# Patient Record
Sex: Male | Born: 1945 | Race: White | Hispanic: No | Marital: Married | Smoking: Former smoker
Health system: Southern US, Community
[De-identification: ages and names within clinical notes are randomized; demographics above are authoritative.]

## PROBLEM LIST (undated history)

## (undated) DIAGNOSIS — I10 Essential (primary) hypertension: Secondary | ICD-10-CM

## (undated) DIAGNOSIS — R011 Cardiac murmur, unspecified: Secondary | ICD-10-CM

## (undated) DIAGNOSIS — E785 Hyperlipidemia, unspecified: Secondary | ICD-10-CM

## (undated) DIAGNOSIS — I38 Endocarditis, valve unspecified: Secondary | ICD-10-CM

## (undated) DIAGNOSIS — M1990 Unspecified osteoarthritis, unspecified site: Secondary | ICD-10-CM

## (undated) DIAGNOSIS — T753XXA Motion sickness, initial encounter: Secondary | ICD-10-CM

## (undated) DIAGNOSIS — R0600 Dyspnea, unspecified: Secondary | ICD-10-CM

## (undated) DIAGNOSIS — G4730 Sleep apnea, unspecified: Secondary | ICD-10-CM

## (undated) DIAGNOSIS — K219 Gastro-esophageal reflux disease without esophagitis: Secondary | ICD-10-CM

## (undated) HISTORY — PX: ESOPHAGOGASTRODUODENOSCOPY: SHX1529

## (undated) HISTORY — PX: HEMORRHOID SURGERY: SHX153

## (undated) HISTORY — DX: Hyperlipidemia, unspecified: E78.5

## (undated) HISTORY — PX: COLONOSCOPY: SHX174

---

## 2004-08-10 ENCOUNTER — Inpatient Hospital Stay: Payer: Self-pay | Admitting: Surgery

## 2004-08-10 ENCOUNTER — Ambulatory Visit: Payer: Self-pay | Admitting: Internal Medicine

## 2007-08-05 ENCOUNTER — Ambulatory Visit: Payer: Self-pay | Admitting: Internal Medicine

## 2008-03-27 ENCOUNTER — Ambulatory Visit: Payer: Self-pay | Admitting: Gastroenterology

## 2010-07-14 ENCOUNTER — Ambulatory Visit: Payer: Self-pay | Admitting: Internal Medicine

## 2010-10-10 ENCOUNTER — Ambulatory Visit: Payer: Self-pay | Admitting: Gastroenterology

## 2010-10-13 LAB — PATHOLOGY REPORT

## 2010-11-16 ENCOUNTER — Ambulatory Visit: Payer: Self-pay | Admitting: Gastroenterology

## 2011-03-26 ENCOUNTER — Emergency Department: Payer: Self-pay | Admitting: Internal Medicine

## 2011-03-26 LAB — HEPATIC FUNCTION PANEL A (ARMC)
Albumin: 3.4 g/dL (ref 3.4–5.0)
Alkaline Phosphatase: 52 U/L (ref 50–136)
Bilirubin,Total: 0.8 mg/dL (ref 0.2–1.0)
SGOT(AST): 28 U/L (ref 15–37)
SGPT (ALT): 27 U/L

## 2011-03-26 LAB — CBC
HCT: 44.9 % (ref 40.0–52.0)
HGB: 14.9 g/dL (ref 13.0–18.0)
MCH: 28.3 pg (ref 26.0–34.0)
MCHC: 33.2 g/dL (ref 32.0–36.0)
MCV: 85 fL (ref 80–100)

## 2011-03-26 LAB — URINALYSIS, COMPLETE
Bacteria: NONE SEEN
Bilirubin,UR: NEGATIVE
Glucose,UR: NEGATIVE mg/dL (ref 0–75)
Ketone: NEGATIVE
Ph: 7 (ref 4.5–8.0)
RBC,UR: NONE SEEN /HPF (ref 0–5)
Specific Gravity: 1.002 (ref 1.003–1.030)
Squamous Epithelial: NONE SEEN
WBC UR: <1 /HPF (ref 0–5)

## 2011-03-26 LAB — BASIC METABOLIC PANEL
Anion Gap: 8 (ref 7–16)
BUN: 11 mg/dL (ref 7–18)
Chloride: 101 mmol/L (ref 98–107)
Co2: 29 mmol/L (ref 21–32)
Creatinine: 0.87 mg/dL (ref 0.60–1.30)
EGFR (African American): >60
Potassium: 3.6 mmol/L (ref 3.5–5.1)
Sodium: 138 mmol/L (ref 136–145)

## 2011-03-27 LAB — URINE CULTURE

## 2011-04-27 ENCOUNTER — Ambulatory Visit: Payer: Self-pay | Admitting: Family Medicine

## 2011-04-27 LAB — CREATININE, SERUM: EGFR (African American): >60

## 2011-11-29 ENCOUNTER — Ambulatory Visit: Payer: Self-pay | Admitting: Gastroenterology

## 2012-10-11 ENCOUNTER — Ambulatory Visit: Payer: Self-pay | Admitting: Family Medicine

## 2013-03-19 ENCOUNTER — Ambulatory Visit: Payer: Self-pay | Admitting: Gastroenterology

## 2013-11-11 ENCOUNTER — Ambulatory Visit: Payer: Self-pay | Admitting: Ophthalmology

## 2014-08-06 ENCOUNTER — Other Ambulatory Visit: Payer: Self-pay | Admitting: Unknown Physician Specialty

## 2014-08-06 DIAGNOSIS — M25512 Pain in left shoulder: Secondary | ICD-10-CM

## 2014-08-06 DIAGNOSIS — M7542 Impingement syndrome of left shoulder: Secondary | ICD-10-CM

## 2014-08-18 ENCOUNTER — Ambulatory Visit
Admission: RE | Admit: 2014-08-18 | Discharge: 2014-08-18 | Disposition: A | Discharge status: Home / Self Care | Payer: Medicare PPO | Source: Ambulatory Visit | Attending: Unknown Physician Specialty | Admitting: Unknown Physician Specialty

## 2014-08-18 DIAGNOSIS — M25512 Pain in left shoulder: Secondary | ICD-10-CM

## 2014-08-18 DIAGNOSIS — M7542 Impingement syndrome of left shoulder: Secondary | ICD-10-CM

## 2014-08-18 DIAGNOSIS — M75112 Incomplete rotator cuff tear or rupture of left shoulder, not specified as traumatic: Secondary | ICD-10-CM | POA: Diagnosis not present

## 2014-09-07 ENCOUNTER — Other Ambulatory Visit: Payer: Self-pay | Admitting: Family Medicine

## 2014-09-07 DIAGNOSIS — N5089 Other specified disorders of the male genital organs: Secondary | ICD-10-CM

## 2014-09-10 ENCOUNTER — Ambulatory Visit: Payer: Medicare PPO

## 2014-09-15 ENCOUNTER — Ambulatory Visit
Admission: RE | Admit: 2014-09-15 | Discharge: 2014-09-15 | Disposition: A | Discharge status: Home / Self Care | Payer: Medicare PPO | Source: Ambulatory Visit | Attending: Family Medicine | Admitting: Family Medicine

## 2014-09-15 DIAGNOSIS — N508 Other specified disorders of male genital organs: Secondary | ICD-10-CM | POA: Insufficient documentation

## 2014-09-15 DIAGNOSIS — N5089 Other specified disorders of the male genital organs: Secondary | ICD-10-CM

## 2014-09-30 ENCOUNTER — Encounter: Payer: Self-pay | Admitting: *Deleted

## 2014-10-06 NOTE — Discharge Instructions (Signed)

## 2014-10-07 ENCOUNTER — Encounter: Payer: Self-pay | Admitting: *Deleted

## 2014-10-07 ENCOUNTER — Encounter
Admission: RE | Disposition: A | Discharge status: Home / Self Care | Payer: Self-pay | Source: Ambulatory Visit | Attending: Gastroenterology

## 2014-10-07 ENCOUNTER — Ambulatory Visit
Admission: RE | Admit: 2014-10-07 | Discharge: 2014-10-07 | Disposition: A | Discharge status: Home / Self Care | Payer: Medicare PPO | Source: Ambulatory Visit | Attending: Gastroenterology | Admitting: Gastroenterology

## 2014-10-07 ENCOUNTER — Ambulatory Visit: Payer: Medicare PPO | Admitting: Anesthesiology

## 2014-10-07 DIAGNOSIS — K210 Gastro-esophageal reflux disease with esophagitis: Secondary | ICD-10-CM | POA: Insufficient documentation

## 2014-10-07 DIAGNOSIS — E785 Hyperlipidemia, unspecified: Secondary | ICD-10-CM | POA: Insufficient documentation

## 2014-10-07 DIAGNOSIS — Z79899 Other long term (current) drug therapy: Secondary | ICD-10-CM | POA: Insufficient documentation

## 2014-10-07 DIAGNOSIS — Z87891 Personal history of nicotine dependence: Secondary | ICD-10-CM | POA: Diagnosis not present

## 2014-10-07 DIAGNOSIS — M7542 Impingement syndrome of left shoulder: Secondary | ICD-10-CM | POA: Insufficient documentation

## 2014-10-07 DIAGNOSIS — R1310 Dysphagia, unspecified: Secondary | ICD-10-CM | POA: Insufficient documentation

## 2014-10-07 DIAGNOSIS — I10 Essential (primary) hypertension: Secondary | ICD-10-CM | POA: Insufficient documentation

## 2014-10-07 DIAGNOSIS — R0602 Shortness of breath: Secondary | ICD-10-CM | POA: Insufficient documentation

## 2014-10-07 HISTORY — DX: Motion sickness, initial encounter: T75.3XXA

## 2014-10-07 HISTORY — DX: Gastro-esophageal reflux disease without esophagitis: K21.9

## 2014-10-07 HISTORY — PX: ESOPHAGOGASTRODUODENOSCOPY: SHX5428

## 2014-10-07 HISTORY — DX: Unspecified osteoarthritis, unspecified site: M19.90

## 2014-10-07 HISTORY — DX: Essential (primary) hypertension: I10

## 2014-10-07 SURGERY — EGD (ESOPHAGOGASTRODUODENOSCOPY)
Anesthesia: Monitor Anesthesia Care

## 2014-10-07 MED ORDER — LIDOCAINE HCL (CARDIAC) 20 MG/ML IV SOLN
INTRAVENOUS | Status: DC | PRN
  Administered 2014-10-07: 50 mg via INTRAVENOUS

## 2014-10-07 MED ORDER — STERILE WATER FOR IRRIGATION IR SOLN
Status: DC | PRN
  Administered 2014-10-07: 09:00:00

## 2014-10-07 MED ORDER — LACTATED RINGERS IV SOLN
INTRAVENOUS | Status: DC
  Administered 2014-10-07: 08:00:00 via INTRAVENOUS

## 2014-10-07 MED ORDER — PROPOFOL 10 MG/ML IV BOLUS
INTRAVENOUS | Status: DC | PRN
  Administered 2014-10-07: 50 mg via INTRAVENOUS
  Administered 2014-10-07 (×3): 30 mg via INTRAVENOUS

## 2014-10-07 MED ORDER — SODIUM CHLORIDE 0.9 % IV SOLN: INTRAVENOUS | Status: DC

## 2014-10-07 MED ORDER — GLYCOPYRROLATE 0.2 MG/ML IJ SOLN
INTRAMUSCULAR | Status: DC | PRN
  Administered 2014-10-07: 0.2 mg via INTRAVENOUS

## 2014-10-07 SURGICAL SUPPLY — 41 items
BALLN DILATOR 10-12 8 (BALLOONS)
BALLN DILATOR 12-15 8 (BALLOONS)
BALLN DILATOR 15-18 8 (BALLOONS)
BALLN DILATOR CRE 0-12 8 (BALLOONS)
BALLN DILATOR ESOPH 8 10 CRE (MISCELLANEOUS) IMPLANT
BALLOON DILATOR 12-15 8 (BALLOONS) IMPLANT
BALLOON DILATOR 15-18 8 (BALLOONS) IMPLANT
BALLOON DILATOR CRE 0-12 8 (BALLOONS) IMPLANT
BLOCK BITE 60FR ADLT L/F GRN (MISCELLANEOUS) ×3 IMPLANT
CANISTER SUCT 1200ML W/VALVE (MISCELLANEOUS) ×3 IMPLANT
FCP ESCP3.2XJMB 240X2.8X (MISCELLANEOUS)
FORCEPS BIOP RAD 4 LRG CAP 4 (CUTTING FORCEPS) IMPLANT
FORCEPS BIOP RJ4 240 W/NDL (MISCELLANEOUS)
FORCEPS ESCP3.2XJMB 240X2.8X (MISCELLANEOUS) IMPLANT
GOWN CVR UNV OPN BCK APRN NK (MISCELLANEOUS) ×1 IMPLANT
GOWN ISOL THUMB LOOP REG UNIV (MISCELLANEOUS) ×2
GOWN STRL REUS W/ TWL LRG LVL3 (GOWN DISPOSABLE) ×1 IMPLANT
GOWN STRL REUS W/TWL LRG LVL3 (GOWN DISPOSABLE) ×2
HEMOCLIP INSTINCT (CLIP) IMPLANT
INJECTOR VARIJECT VIN23 (MISCELLANEOUS) IMPLANT
KIT CO2 TUBING (TUBING) IMPLANT
KIT DEFENDO VALVE AND CONN (KITS) IMPLANT
KIT ENDO PROCEDURE OLY (KITS) ×3 IMPLANT
LIGATOR MULTIBAND 6SHOOTER MBL (MISCELLANEOUS) IMPLANT
MARKER SPOT ENDO TATTOO 5ML (MISCELLANEOUS) IMPLANT
PAD GROUND ADULT SPLIT (MISCELLANEOUS) IMPLANT
SNARE SHORT THROW 13M SML OVAL (MISCELLANEOUS) IMPLANT
SNARE SHORT THROW 30M LRG OVAL (MISCELLANEOUS) IMPLANT
SPOT EX ENDOSCOPIC TATTOO (MISCELLANEOUS)
SUCTION POLY TRAP 4CHAMBER (MISCELLANEOUS) IMPLANT
SYR INFLATION 60ML (SYRINGE) IMPLANT
TRAP SUCTION POLY (MISCELLANEOUS) IMPLANT
TUBING CONN 6MMX3.1M (TUBING)
TUBING SUCTION CONN 0.25 STRL (TUBING) IMPLANT
UNDERPAD 30X60 958B10 (PK) (MISCELLANEOUS) IMPLANT
VALVE BIOPSY ENDO (VALVE) IMPLANT
VARIJECT INJECTOR VIN23 (MISCELLANEOUS)
WATER AUXILLARY (MISCELLANEOUS) IMPLANT
WATER STERILE IRR 250ML POUR (IV SOLUTION) ×3 IMPLANT
WATER STERILE IRR 500ML POUR (IV SOLUTION) IMPLANT
WIRE CRE 18-20MM 8CM F G (MISCELLANEOUS) IMPLANT

## 2014-10-07 NOTE — Op Note (Signed)
[facility] Gastroenterology Patient Name: Steven Basso Procedure Date: 10/07/2014 8:34 AM MRN: [id number] Account #: [id number] Date of Birth: 01-07-1946 Admit Type: Outpatient Age: 69 Room: [facility] OR ROOM 01 Gender: Male Note Status: Finalized Procedure:         Upper GI endoscopy Indications:       Dysphagia, Follow-up of esophageal reflux Providers:         Ezzard Standing. Bluford Kaufmann, MD Referring MD:      Nat Christen. Zada Finders, MD (Referring MD) Medicines:         Monitored Anesthesia Care Complications:     No immediate complications. Procedure:         Pre-Anesthesia Assessment:                    - Prior to the procedure, a History and Physical was                     performed, and patient medications, allergies and                     sensitivities were reviewed. The patient's tolerance of                     previous anesthesia was reviewed.                    - The risks and benefits of the procedure and the sedation                     options and risks were discussed with the patient. All                     questions were answered and informed consent was obtained.                    - After reviewing the risks and benefits, the patient was                     deemed in satisfactory condition to undergo the procedure.                    After obtaining informed consent, the endoscope was passed                     under direct vision. Throughout the procedure, the                     patient's blood pressure, pulse, and oxygen saturations                     were monitored continuously. The was introduced through                     the mouth, and advanced to the second part of duodenum.                     The upper GI endoscopy was accomplished without                     difficulty. The patient tolerated the procedure well. Findings:      The examined esophagus was normal. Biopsies were taken with a cold       forceps for histology.      The entire examined stomach  was normal.  The examined duodenum was normal. Impression:        - Normal esophagus. Biopsied.                    - Normal stomach.                    - Normal examined duodenum. Recommendation:    - Discharge patient to home.                    - Observe patient's clinical course.                    - Continue present medications.                    - Await pathology results.                    - The findings and recommendations were discussed with the                     patient. Procedure Code(s): --- Professional ---                    [phone], Esophagogastroduodenoscopy, flexible, transoral;                     with biopsy, single or multiple Diagnosis Code(s): --- Professional ---                    R13.10, Dysphagia, unspecified                    K21.9, Gastro-esophageal reflux disease without esophagitis CPT copyright 2014 American Medical Association. All rights reserved. The codes documented in this report are preliminary and upon coder review may  be revised to meet current compliance requirements. Wallace Cullens, MD 10/07/2014 9:21:06 AM This report has been signed electronically. Number of Addenda: 0 Note Initiated On: 10/07/2014 8:34 AM Total Procedure Duration: 0 hours 3 minutes 36 seconds       [facility]

## 2014-10-07 NOTE — Anesthesia Preprocedure Evaluation (Signed)
Anesthesia Evaluation  Patient identified by MRN, date of birth, ID band  Airway Mallampati: II  TM Distance: >3 FB Neck ROM: Full    Dental   Pulmonary former smoker,           Cardiovascular hypertension,      Neuro/Psych    GI/Hepatic GERD  Medicated,  Endo/Other    Renal/GU      Musculoskeletal   Abdominal   Peds  Hematology   Anesthesia Other Findings   Reproductive/Obstetrics                             Anesthesia Physical Anesthesia Plan  ASA: II  Anesthesia Plan: MAC   Post-op Pain Management:    Induction: Intravenous  Airway Management Planned:   Additional Equipment:   Intra-op Plan:   Post-operative Plan:   Informed Consent: I have reviewed the patients History and Physical, chart, labs and discussed the procedure including the risks, benefits and alternatives for the proposed anesthesia with the patient or authorized representative who has indicated his/her understanding and acceptance.     Plan Discussed with: CRNA  Anesthesia Plan Comments:         Anesthesia Quick Evaluation

## 2014-10-07 NOTE — Anesthesia Procedure Notes (Signed)
Procedure Name: MAC Performed by: AMYOT, MICHAEL Pre-anesthesia Checklist: Patient identified, Emergency Drugs available, Suction available, Timeout performed and Patient being monitored Patient Re-evaluated:Patient Re-evaluated prior to inductionOxygen Delivery Method: Nasal cannula Placement Confirmation: positive ETCO2       

## 2014-10-07 NOTE — Transfer of Care (Signed)
Immediate Anesthesia Transfer of Care Note  Patient: Perry Taylor  Procedure(s) Performed: Procedure(s): ESOPHAGOGASTRODUODENOSCOPY (EGD) (N/A)  Patient Location: PACU  Anesthesia Type: MAC  Level of Consciousness: awake, alert  and patient cooperative  Airway and Oxygen Therapy: Patient Spontanous Breathing and Patient connected to supplemental oxygen  Post-op Assessment: Post-op Vital signs reviewed, Patient's Cardiovascular Status Stable, Respiratory Function Stable, Patent Airway and No signs of Nausea or vomiting  Post-op Vital Signs: Reviewed and stable  Complications: No apparent anesthesia complications

## 2014-10-07 NOTE — H&P (Signed)
    Primary Care Physician:  Dione Housekeeper, MD Primary Gastroenterologist:  Dr. Bluford Kaufmann  Pre-Procedure History & Physical: HPI:  Perry Taylor is a 69 y.o. male is here for an EGD with possible dilation.   Past Medical History  Diagnosis Date  . Hypertension   . Motion sickness     boats, car-back seat  . Arthritis     fingers  . GERD (gastroesophageal reflux disease)     Past Surgical History  Procedure Laterality Date  . Hemorrhoid surgery    . Esophagogastroduodenoscopy    . Colonoscopy      Prior to Admission medications   Medication Sig Start Date End Date Taking? Authorizing Alexanderjames Berg  atorvastatin (LIPITOR) 20 MG tablet Take 20 mg by mouth daily. PM   Yes Historical Joran Kallal, MD  chlorthalidone (HYGROTON) 25 MG tablet Take 25 mg by mouth daily. AM   Yes Historical Kenly Xiao, MD  lisinopril (PRINIVIL,ZESTRIL) 20 MG tablet Take 20 mg by mouth daily. AM   Yes Historical Yosiah Jasmin, MD  Multiple Vitamins-Minerals (MULTIVITAMIN PO) Take by mouth.   Yes Historical Hopie Pellegrin, MD  Omega-3 Fatty Acids (FISH OIL PO) Take by mouth.   Yes Historical Mariska Daffin, MD  pantoprazole (PROTONIX) 40 MG tablet Take 40 mg by mouth daily. AM   Yes Historical Seeley Hissong, MD  psyllium (METAMUCIL) 58.6 % packet Take 1 packet by mouth daily.   Yes Historical Idona Stach, MD  UNABLE TO FIND Prost X. 1 tab 3 times daily   Yes Historical Lyam Provencio, MD  UNABLE TO FIND Symplex M. 1 tab 3 times daily   Yes Historical Anginette Espejo, MD    Allergies as of 09/10/2014  . (Not on File)    History reviewed. No pertinent family history.  Social History   Social History  . Marital Status: Married    Spouse Name: N/A  . Number of Children: N/A  . Years of Education: N/A   Occupational History  . Not on file.   Social History Main Topics  . Smoking status: Former Games developer  . Smokeless tobacco: Not on file     Comment: Quit 32 yrs ago  . Alcohol Use: No  . Drug Use: Not on file  . Sexual Activity: Not on  file   Other Topics Concern  . Not on file   Social History Narrative    Review of Systems: See HPI, otherwise negative ROS  Physical Exam: BP 128/77 mmHg  Pulse 66  Temp(Src) 97.9 F (36.6 C)  Resp 17  Ht  (1.778 m)  Wt 82.555 kg (182 lb)  BMI 26.11 kg/m2  SpO2 94% General:   Alert,  pleasant and cooperative in NAD Head:  Normocephalic and atraumatic. Neck:  Supple; no masses or thyromegaly. Lungs:  Clear throughout to auscultation.    Heart:  Regular rate and rhythm. Abdomen:  Soft, nontender and nondistended. Normal bowel sounds, without guarding, and without rebound.   Neurologic:  Alert and  oriented x4;  grossly normal neurologically.  Impression/Plan: Perry Taylor is here for an EGD to be performed for dysphagia, GERD. Risks, benefits, limitations, and alternatives regarding  EGD with possible dilation have been reviewed with the patient.  Questions have been answered.  All parties agreeable.   OH, Ezzard Standing, MD  10/07/2014, 8:57 AM

## 2014-10-07 NOTE — Anesthesia Postprocedure Evaluation (Signed)
  Anesthesia Post-op Note  Patient: Perry Taylor  Procedure(s) Performed: Procedure(s): ESOPHAGOGASTRODUODENOSCOPY (EGD) (N/A)  Anesthesia type:MAC  Patient location: PACU  Post pain: Pain level controlled  Post assessment: Post-op Vital signs reviewed, Patient's Cardiovascular Status Stable, Respiratory Function Stable, Patent Airway and No signs of Nausea or vomiting  Post vital signs: Reviewed and stable  Last Vitals:  Filed Vitals:   10/07/14 0926  BP: 130/91  Pulse: 72  Temp: 36.1 C  Resp: 16    Level of consciousness: awake, alert  and patient cooperative  Complications: No apparent anesthesia complications

## 2014-10-09 LAB — SURGICAL PATHOLOGY

## 2015-05-25 ENCOUNTER — Ambulatory Visit: Payer: Medicare PPO | Admitting: Internal Medicine

## 2015-06-11 ENCOUNTER — Encounter: Payer: Self-pay | Admitting: Oncology

## 2015-06-11 ENCOUNTER — Inpatient Hospital Stay: Payer: Medicare PPO | Attending: Oncology | Admitting: Oncology

## 2015-06-11 ENCOUNTER — Inpatient Hospital Stay: Payer: Medicare PPO

## 2015-06-11 VITALS — BP 134/81 | HR 66 | Temp 96.9°F | Wt 182.5 lb

## 2015-06-11 DIAGNOSIS — I10 Essential (primary) hypertension: Secondary | ICD-10-CM

## 2015-06-11 DIAGNOSIS — F17210 Nicotine dependence, cigarettes, uncomplicated: Secondary | ICD-10-CM

## 2015-06-11 DIAGNOSIS — Z87891 Personal history of nicotine dependence: Secondary | ICD-10-CM

## 2015-06-11 DIAGNOSIS — E785 Hyperlipidemia, unspecified: Secondary | ICD-10-CM

## 2015-06-11 DIAGNOSIS — R233 Spontaneous ecchymoses: Secondary | ICD-10-CM

## 2015-06-11 DIAGNOSIS — Z79899 Other long term (current) drug therapy: Secondary | ICD-10-CM | POA: Diagnosis not present

## 2015-06-11 DIAGNOSIS — K219 Gastro-esophageal reflux disease without esophagitis: Secondary | ICD-10-CM | POA: Diagnosis not present

## 2015-06-11 DIAGNOSIS — M129 Arthropathy, unspecified: Secondary | ICD-10-CM

## 2015-06-11 DIAGNOSIS — T148XXA Other injury of unspecified body region, initial encounter: Secondary | ICD-10-CM

## 2015-06-11 DIAGNOSIS — R7989 Other specified abnormal findings of blood chemistry: Secondary | ICD-10-CM | POA: Diagnosis not present

## 2015-06-11 LAB — PLATELET FUNCTION ASSAY
COLLAGEN / ADP: 69 s (ref 0–118)
COLLAGEN / EPINEPHRINE: 82 s (ref 0–193)

## 2015-06-11 LAB — CBC WITH DIFFERENTIAL/PLATELET
BASOS ABS: 0.1 10*3/uL (ref 0–0.1)
BASOS PCT: 1 %
Eosinophils Absolute: 0.1 10*3/uL (ref 0–0.7)
Eosinophils Relative: 1 %
HEMATOCRIT: 43.5 % (ref 40.0–52.0)
HEMOGLOBIN: 14.5 g/dL (ref 13.0–18.0)
LYMPHS PCT: 35 %
Lymphs Abs: 2.8 10*3/uL (ref 1.0–3.6)
MCH: 27.2 pg (ref 26.0–34.0)
MCHC: 33.3 g/dL (ref 32.0–36.0)
MCV: 81.6 fL (ref 80.0–100.0)
Monocytes Absolute: 0.6 10*3/uL (ref 0.2–1.0)
Monocytes Relative: 7 %
NEUTROS ABS: 4.6 10*3/uL (ref 1.4–6.5)
Neutrophils Relative %: 56 %
Platelets: 206 10*3/uL (ref 150–440)
RBC: 5.34 MIL/uL (ref 4.40–5.90)
RDW: 14.1 % (ref 11.5–14.5)
WBC: 8.1 10*3/uL (ref 3.8–10.6)

## 2015-06-11 LAB — PROTIME-INR
INR: 0.95
Prothrombin Time: 12.9 seconds (ref 11.4–15.0)

## 2015-06-11 LAB — APTT: APTT: 25 s (ref 24–36)

## 2015-06-11 NOTE — Progress Notes (Signed)
Patient has been having easy bruising on his arms for the past year and seems to be getting worse.

## 2015-06-16 LAB — COMP PANEL: LEUKEMIA/LYMPHOMA

## 2015-06-16 LAB — COAG STUDIES INTERP REPORT: PDF Image: 0

## 2015-06-16 LAB — VON WILLEBRAND PANEL
Coagulation Factor VIII: 197 % — ABNORMAL HIGH (ref 57–163)
RISTOCETIN CO-FACTOR, PLASMA: 243 % — AB (ref 50–200)
Von Willebrand Antigen, Plasma: 219 % — ABNORMAL HIGH (ref 50–200)

## 2015-06-17 NOTE — Progress Notes (Signed)
[facility]  Telephone:([phone]) [phone] Fax:(336) [phone]  ID: Vicenta Dunning OB: 1945-02-18  MR#: [id number]  AOZ#:[id number]  Patient Care Team: Dione Housekeeper, MD as PCP - General (Family Medicine)  CHIEF COMPLAINT:  Chief Complaint  Patient presents with  . New Evaluation    hematology    INTERVAL HISTORY: Patient is a 70 year old male who has noted increased easy bruising isolated to his arms for approximately one year, but admits over the past 4-6 weeks it has been getting significantly worse. He has not noted any bruising anywhere else. He denies any bleeding. He otherwise feels well. He has no neurologic complaints. He denies any fevers, night sweats, or weight loss. He has no chest pain or shortness of breath. He denies any nausea, vomiting, constipation, or diarrhea. He has no urinary complaints. Patient otherwise feels well and offers no further specific complaints.  REVIEW OF SYSTEMS:   Review of Systems  Constitutional: Negative.  Negative for fever, weight loss and malaise/fatigue.  Respiratory: Negative.  Negative for cough and shortness of breath.   Cardiovascular: Negative.  Negative for chest pain.  Gastrointestinal: Negative.  Negative for blood in stool and melena.  Genitourinary: Negative.   Musculoskeletal: Negative.   Neurological: Negative.  Negative for weakness.  Endo/Heme/Allergies: Bruises/bleeds easily.  Psychiatric/Behavioral: Negative.     As per HPI. Otherwise, a complete review of systems is negatve.  PAST MEDICAL HISTORY: Past Medical History  Diagnosis Date  . Hypertension   . Motion sickness     boats, car-back seat  . Arthritis     fingers  . GERD (gastroesophageal reflux disease)   . Hyperlipidemia     PAST SURGICAL HISTORY: Past Surgical History  Procedure Laterality Date  . Hemorrhoid surgery    . Esophagogastroduodenoscopy    . Colonoscopy    . Esophagogastroduodenoscopy N/A 10/07/2014    Procedure:  ESOPHAGOGASTRODUODENOSCOPY (EGD);  Surgeon: Wallace Cullens, MD;  Location: [facility];  Service: Gastroenterology;  Laterality: N/A;    FAMILY HISTORY No family history on file.     ADVANCED DIRECTIVES:    HEALTH MAINTENANCE: Social History  Substance Use Topics  . Smoking status: Former Smoker -- 1.00 packs/day for 15 years    Types: Cigarettes  . Smokeless tobacco: Former Neurosurgeon    Quit date: 06/11/1983     Comment: Quit 32 yrs ago  . Alcohol Use: No     Colonoscopy:  PAP:  Bone density:  Lipid panel:  Allergies  Allergen Reactions  . Flomax [Tamsulosin] Itching    Current Outpatient Prescriptions  Medication Sig Dispense Refill  . atorvastatin (LIPITOR) 20 MG tablet Take by mouth.    . chlorthalidone (HYGROTON) 25 MG tablet     . lisinopril (PRINIVIL,ZESTRIL) 20 MG tablet     . Multiple Vitamins-Minerals (MULTIVITAMIN PO) Take by mouth.    . Omega-3 Fatty Acids (FISH OIL PO) Take by mouth.    . pantoprazole (PROTONIX) 40 MG tablet Take 40 mg by mouth daily. AM    . psyllium (METAMUCIL) 58.6 % packet Take 1 packet by mouth daily.    Marland Kitchen UNABLE TO FIND Prost X. 1 tab 3 times daily    . UNABLE TO FIND Symplex M. 1 tab 3 times daily     No current facility-administered medications for this visit.    OBJECTIVE: Filed Vitals:   06/11/15 1131  BP: 134/81  Pulse: 66  Temp: 96.9 F (36.1 C)     Body mass index is  26.19 kg/(m^2).    ECOG FS:0 - Asymptomatic  General: Well-developed, well-nourished, no acute distress. Eyes: Pink conjunctiva, anicteric sclera. HEENT: Normocephalic, moist mucous membranes, clear oropharnyx. Lungs: Clear to auscultation bilaterally. Heart: Regular rate and rhythm. No rubs, murmurs, or gallops. Abdomen: Soft, nontender, nondistended. No organomegaly noted, normoactive bowel sounds. Musculoskeletal: No edema, cyanosis, or clubbing. Neuro: Alert, answering all questions appropriately. Cranial nerves grossly intact. Skin: No rashes or  petechiae noted. Ecchymosis noted on both forearms, no ecchymosis elsewhere on body.  Psych: Normal affect. Lymphatics: No cervical, calvicular, axillary or inguinal LAD.   LAB RESULTS:  Lab Results  Component Value Date   NA 138 03/26/2011   K 3.6 03/26/2011   CL 101 03/26/2011   CO2 29 03/26/2011   GLUCOSE 91 03/26/2011   BUN 11 03/26/2011   CREATININE 1.05 04/27/2011   CALCIUM 8.9 03/26/2011   PROT 7.1 03/26/2011   ALBUMIN 3.4 03/26/2011   AST 28 03/26/2011   ALT 27 03/26/2011   ALKPHOS 52 03/26/2011   BILITOT 0.8 03/26/2011   GFRNONAA >60 04/27/2011   GFRAA >60 04/27/2011    Lab Results  Component Value Date   WBC 8.1 06/11/2015   NEUTROABS 4.6 06/11/2015   HGB 14.5 06/11/2015   HCT 43.5 06/11/2015   MCV 81.6 06/11/2015   PLT 206 06/11/2015     STUDIES: No results found.  ASSESSMENT: Easy bruising.  PLAN:    1. Easy bruising: Likely traumatic. Patient's platelet count and platelet function assay are within normal limits. He has a normal PT and PTT as well. Finally, von Willebrand's panel is elevated. With bleeding or bruising problems, I would expect a von Willebrand's panel to be decreased. Peripheral blood flow cytometry is also negative. Patient has no obvious evidence of an underlying bleeding diathesis. No further intervention is needed at this time. No follow-up has been scheduled.  Greater than 30 minutes was spent in discussion of which greater than 50% was consultation.  Patient expressed understanding and was in agreement with this plan. He also understands that He can call clinic at any time with any questions, concerns, or complaints.    Jeralyn Ruthsimothy J Finnegan, MD   06/17/2015 8:52 AM

## 2015-06-18 ENCOUNTER — Telehealth: Payer: Self-pay | Admitting: *Deleted

## 2015-06-18 NOTE — Telephone Encounter (Signed)
Per Dr. Orlie DakinFinnegan lab results are normal, patient does not need to keep follow up appointment. Patient notified and verbalized understanding of plan. All future appointment have been cancelled.

## 2015-07-07 ENCOUNTER — Ambulatory Visit: Payer: Medicare PPO | Admitting: Oncology

## 2016-01-07 ENCOUNTER — Encounter: Payer: Self-pay | Admitting: *Deleted

## 2016-01-11 NOTE — Discharge Instructions (Signed)
Cataract Surgery, Care After °Refer to this sheet in the next few weeks. These instructions provide you with information about caring for yourself after your procedure. Your health care provider may also give you more specific instructions. Your treatment has been planned according to current medical practices, but problems sometimes occur. Call your health care provider if you have any problems or questions after your procedure. °What can I expect after the procedure? °After the procedure, it is common to have: °· Itching. °· Discomfort. °· Fluid discharge. °· Sensitivity to light and to touch. °· Bruising. °Follow these instructions at home: °Eye Care  °· Check your eye every day for signs of infection. Watch for: °¨ Redness, swelling, or pain. °¨ Fluid, blood, or pus. °¨ Warmth. °¨ Bad smell. °Activity  °· Avoid strenuous activities, such as playing contact sports, for as long as told by your health care provider. °· Do not drive or operate heavy machinery until your health care provider approves. °· Do not bend or lift heavy objects . Bending increases pressure in the eye. You can walk, climb stairs, and do light household chores. °· Ask your health care provider when you can return to work. If you work in a dusty environment, you may be advised to wear protective eyewear for a period of time. °General instructions  °· Take or apply over-the-counter and prescription medicines only as told by your health care provider. This includes eye drops. °· Do not touch or rub your eyes. °· If you were given a protective shield, wear it as told by your health care provider. If you were not given a protective shield, wear sunglasses as told by your health care provider to protect your eyes. °· Keep the area around your eye clean and dry. Avoid swimming or allowing water to hit you directly in the face while showering until told by your health care provider. Keep soap and shampoo out of your eyes. °· Do not put a contact lens  into the affected eye or eyes until your health care provider approves. °· Keep all follow-up visits as told by your health care provider. This is important. °Contact a health care provider if: ° °· You have increased bruising around your eye. °· You have pain that is not helped with medicine. °· You have a fever. °· You have redness, swelling, or pain in your eye. °· You have fluid, blood, or pus coming from your incision. °· Your vision gets worse. °Get help right away if: °· You have sudden vision loss. °This information is not intended to replace advice given to you by your health care provider. Make sure you discuss any questions you have with your health care provider. °Document Released: 07/15/2004 Document Revised: 05/06/2015 Document Reviewed: 11/05/2014 °Elsevier Interactive Patient Education © 2017 Else[employer] Inc[employer] ° ° ° ° °General Anesthesia, Adult, Care After °These instructions provide you with information about caring for yourself after your procedure. Your health care provider may also give you more specific instructions. Your treatment has been planned according to current medical practices, but problems sometimes occur. Call your health care provider if you have any problems or questions after your procedure. °What can I expect after the procedure? °After the procedure, it is common to have: °· Vomiting. °· A sore throat. °· Mental slowness. °It is common to feel: °· Nauseous. °· Cold or shivery. °· Sleepy. °· Tired. °· Sore or achy, even in parts of your body where you did not have surgery. °Follow these instructions at   home: °For at least 24 hours after the procedure:  °· Do not: °¨ Participate in activities where you could fall or become injured. °¨ Drive. °¨ Use heavy machinery. °¨ Drink alcohol. °¨ Take sleeping pills or medicines that cause drowsiness. °¨ Make important decisions or sign legal documents. °¨ Take care of children on your own. °· Rest. °Eating and drinking  °· If you vomit, drink  water, juice, or soup when you can drink without vomiting. °· Drink enough fluid to keep your urine clear or pale yellow. °· Make sure you have little or no nausea before eating solid foods. °· Follow the diet recommended by your health care provider. °General instructions  °· Have a responsible adult stay with you until you are awake and alert. °· Return to your normal activities as told by your health care provider. Ask your health care provider what activities are safe for you. °· Take over-the-counter and prescription medicines only as told by your health care provider. °· If you smoke, do not smoke without supervision. °· Keep all follow-up visits as told by your health care provider. This is important. °Contact a health care provider if: °· You continue to have nausea or vomiting at home, and medicines are not helpful. °· You cannot drink fluids or start eating again. °· You cannot urinate after 8-12 hours. °· You develop a skin rash. °· You have fever. °· You have increasing redness at the site of your procedure. °Get help right away if: °· You have difficulty breathing. °· You have chest pain. °· You have unexpected bleeding. °· You feel that you are having a life-threatening or urgent problem. °This information is not intended to replace advice given to you by your health care provider. Make sure you discuss any questions you have with your health care provider. °Document Released: 04/03/2000 Document Revised: 05/31/2015 Document Reviewed: 12/10/2014 °Elsevier Interactive Patient Education © 2017 Elsevier Inc. ° °

## 2016-01-17 ENCOUNTER — Ambulatory Visit: Payer: Medicare PPO | Admitting: Anesthesiology

## 2016-01-17 ENCOUNTER — Ambulatory Visit
Admission: RE | Admit: 2016-01-17 | Discharge: 2016-01-17 | Disposition: A | Discharge status: Home / Self Care | Payer: Medicare PPO | Source: Ambulatory Visit | Attending: Ophthalmology | Admitting: Ophthalmology

## 2016-01-17 ENCOUNTER — Encounter
Admission: RE | Disposition: A | Discharge status: Home / Self Care | Payer: Self-pay | Source: Ambulatory Visit | Attending: Ophthalmology

## 2016-01-17 DIAGNOSIS — E7800 Pure hypercholesterolemia, unspecified: Secondary | ICD-10-CM | POA: Insufficient documentation

## 2016-01-17 DIAGNOSIS — H2511 Age-related nuclear cataract, right eye: Secondary | ICD-10-CM | POA: Insufficient documentation

## 2016-01-17 DIAGNOSIS — Z79899 Other long term (current) drug therapy: Secondary | ICD-10-CM | POA: Insufficient documentation

## 2016-01-17 DIAGNOSIS — I10 Essential (primary) hypertension: Secondary | ICD-10-CM | POA: Diagnosis not present

## 2016-01-17 DIAGNOSIS — Z87891 Personal history of nicotine dependence: Secondary | ICD-10-CM | POA: Insufficient documentation

## 2016-01-17 DIAGNOSIS — K219 Gastro-esophageal reflux disease without esophagitis: Secondary | ICD-10-CM | POA: Insufficient documentation

## 2016-01-17 HISTORY — PX: CATARACT EXTRACTION W/PHACO: SHX586

## 2016-01-17 SURGERY — PHACOEMULSIFICATION, CATARACT, WITH IOL INSERTION
Anesthesia: Monitor Anesthesia Care | Site: Eye | Laterality: Right | Wound class: Clean

## 2016-01-17 MED ORDER — FENTANYL CITRATE (PF) 100 MCG/2ML IJ SOLN
INTRAMUSCULAR | Status: DC | PRN
  Administered 2016-01-17: 100 ug via INTRAVENOUS

## 2016-01-17 MED ORDER — ARMC OPHTHALMIC DILATING DROPS
1.0000 "application " | OPHTHALMIC | Status: DC | PRN
  Administered 2016-01-17 (×3): 1 via OPHTHALMIC

## 2016-01-17 MED ORDER — CEFUROXIME OPHTHALMIC INJECTION 1 MG/0.1 ML
INJECTION | OPHTHALMIC | Status: DC | PRN
  Administered 2016-01-17: 0.1 mL via INTRACAMERAL

## 2016-01-17 MED ORDER — EPINEPHRINE PF 1 MG/ML IJ SOLN
INTRAOCULAR | Status: DC | PRN
  Administered 2016-01-17: 70 mL via OPHTHALMIC

## 2016-01-17 MED ORDER — NA HYALUR & NA CHOND-NA HYALUR 0.4-0.35 ML IO KIT
PACK | INTRAOCULAR | Status: DC | PRN
  Administered 2016-01-17: 1 mL via INTRAOCULAR

## 2016-01-17 MED ORDER — MOXIFLOXACIN HCL 0.5 % OP SOLN
1.0000 [drp] | OPHTHALMIC | Status: DC | PRN
  Administered 2016-01-17 (×3): 1 [drp] via OPHTHALMIC

## 2016-01-17 MED ORDER — MIDAZOLAM HCL 2 MG/2ML IJ SOLN
INTRAMUSCULAR | Status: DC | PRN
  Administered 2016-01-17: 2 mg via INTRAVENOUS

## 2016-01-17 MED ORDER — LIDOCAINE HCL (PF) 4 % IJ SOLN
INTRAOCULAR | Status: DC | PRN
  Administered 2016-01-17: 1 mL via OPHTHALMIC

## 2016-01-17 SURGICAL SUPPLY — 30 items
APPLICATOR COTTON TIP 3IN (MISCELLANEOUS) ×2 IMPLANT
CANNULA ANT/CHMB 27GA (MISCELLANEOUS) ×2 IMPLANT
DISSECTOR HYDRO NUCLEUS 50X22 (MISCELLANEOUS) ×2 IMPLANT
GLOVE BIO SURGEON STRL SZ7 (GLOVE) ×2 IMPLANT
GLOVE SURG LX 6.5 MICRO (GLOVE) ×1
GLOVE SURG LX STRL 6.5 MICRO (GLOVE) ×1 IMPLANT
GOWN STRL REUS W/ TWL LRG LVL3 (GOWN DISPOSABLE) ×2 IMPLANT
GOWN STRL REUS W/TWL LRG LVL3 (GOWN DISPOSABLE) ×2
LENS IOL SYMFON TORIC 300 18.0 (Intraocular Lens) ×1 IMPLANT
LENS IOL SYMFONY TORIC 18.0 (Intraocular Lens) ×1 IMPLANT
LENS IOL SYMFONY TRC 300 18.0 (Intraocular Lens) ×1 IMPLANT
MARKER SKIN DUAL TIP RULER LAB (MISCELLANEOUS) ×2 IMPLANT
NEEDLE FILTER BLUNT 18X 1/2SAF (NEEDLE) ×1
NEEDLE FILTER BLUNT 18X1 1/2 (NEEDLE) ×1 IMPLANT
PACK CATARACT BRASINGTON (MISCELLANEOUS) ×2 IMPLANT
PACK EYE AFTER SURG (MISCELLANEOUS) ×2 IMPLANT
PACK OPTHALMIC (MISCELLANEOUS) ×2 IMPLANT
RING MALYGIN 7.0 (MISCELLANEOUS) IMPLANT
SOL BAL SALT 15ML (MISCELLANEOUS)
SOLUTION BAL SALT 15ML (MISCELLANEOUS) IMPLANT
SUT ETHILON 10-0 CS-B-6CS-B-6 (SUTURE)
SUT VICRYL  9 0 (SUTURE)
SUT VICRYL 9 0 (SUTURE) IMPLANT
SUTURE EHLN 10-0 CS-B-6CS-B-6 (SUTURE) IMPLANT
SYR 3ML LL SCALE MARK (SYRINGE) ×2 IMPLANT
SYR TB 1ML LUER SLIP (SYRINGE) ×2 IMPLANT
WATER STERILE IRR 250ML POUR (IV SOLUTION) ×2 IMPLANT
WATER STERILE IRR 500ML POUR (IV SOLUTION) IMPLANT
WICK EYE OCUCEL (MISCELLANEOUS) IMPLANT
WIPE NON LINTING 3.25X3.25 (MISCELLANEOUS) ×2 IMPLANT

## 2016-01-17 NOTE — Anesthesia Preprocedure Evaluation (Signed)
Anesthesia Evaluation  Patient identified by MRN, date of birth, ID band Patient awake    Reviewed: Allergy & Precautions, H&P , NPO status , Patient's Chart, lab work & pertinent test results, reviewed documented beta blocker date and time   Airway Mallampati: II  TM Distance: >3 FB Neck ROM: full    Dental no notable dental hx.    Pulmonary neg pulmonary ROS, former smoker,    Pulmonary exam normal breath sounds clear to auscultation       Cardiovascular Exercise Tolerance: Good hypertension,  Rhythm:regular Rate:Normal     Neuro/Psych negative neurological ROS  negative psych ROS   GI/Hepatic Neg liver ROS, GERD  ,  Endo/Other  negative endocrine ROS  Renal/GU negative Renal ROS  negative genitourinary   Musculoskeletal   Abdominal   Peds  Hematology negative hematology ROS (+)   Anesthesia Other Findings   Reproductive/Obstetrics negative OB ROS                             Anesthesia Physical Anesthesia Plan  ASA: II  Anesthesia Plan: MAC   Post-op Pain Management:    Induction:   Airway Management Planned:   Additional Equipment:   Intra-op Plan:   Post-operative Plan:   Informed Consent: I have reviewed the patients History and Physical, chart, labs and discussed the procedure including the risks, benefits and alternatives for the proposed anesthesia with the patient or authorized representative who has indicated his/her understanding and acceptance.   Dental Advisory Given  Plan Discussed with: CRNA  Anesthesia Plan Comments:         Anesthesia Quick Evaluation

## 2016-01-17 NOTE — Anesthesia Postprocedure Evaluation (Signed)
Anesthesia Post Note  Patient: Perry Taylor  Procedure(s) Performed: Procedure(s) (LRB): CATARACT EXTRACTION PHACO AND INTRAOCULAR LENS PLACEMENT (IOC) (Right)  Patient location during evaluation: PACU Anesthesia Type: MAC Level of consciousness: awake and alert Pain management: pain level controlled Vital Signs Assessment: post-procedure vital signs reviewed and stable Respiratory status: spontaneous breathing, nonlabored ventilation, respiratory function stable and patient connected to nasal cannula oxygen Cardiovascular status: stable and blood pressure returned to baseline Anesthetic complications: no    Alisa Graff

## 2016-01-17 NOTE — H&P (Signed)
H+P reviewed and is up to date, please see paper chart.  

## 2016-01-17 NOTE — Anesthesia Procedure Notes (Signed)
Procedure Name: MAC Date/Time: 01/17/2016 8:37 AM Performed by: Janna Arch Pre-anesthesia Checklist: Patient identified, Emergency Drugs available, Suction available and Patient being monitored Patient Re-evaluated:Patient Re-evaluated prior to inductionOxygen Delivery Method: Nasal cannula

## 2016-01-17 NOTE — Op Note (Signed)
Date of Surgery: 01/17/2016  PREOPERATIVE DIAGNOSES: Visually significant nuclear sclerotic cataract, right eye.  POSTOPERATIVE DIAGNOSES: Same  PROCEDURES PERFORMED: Cataract extraction with intraocular lens implant, right eye.  SURGEON: Almon Hercules, M.D.  ANESTHESIA: MAC and topical  IMPLANTS: Tecnis [id number] +18.0 D  Implant Name Type Inv. Item Serial No. Manufacturer Lot No. LRB No. Used  tecnis symfony toric iol Intraocular Lens   [id number] [employer]   Right 1     COMPLICATIONS: None.  DESCRIPTION OF PROCEDURE: Therapeutic options were discussed with the patient preoperatively, including a discussion of risks and benefits of surgery. Informed consent was obtained. An IOL-Master and immersion biometry were used to take the lens measurements, and a dilated fundus exam was performed within 6 months of the surgical date.  The patient had corneal axes marked in preop, was premedicated and brought to the operating room and placed on the operating table in the supine position. After adequate anesthesia, the patient was prepped and draped in the usual sterile ophthalmic fashion. A wire lid speculum was inserted and the microscope was positioned. A Superblade was used to create a paracentesis site at the limbus and a small amount of dilute preservative free lidocaine was instilled into the anterior chamber, followed by dispersive viscoelastic. A clear corneal incision was created temporally using a 2.4 mm keratome blade. Capsulorrhexis was then performed. In situ phacoemulsification was performed.  Cortical material was removed with the irrigation-aspiration unit. Dispersive viscoelastic was instilled to open the capsular bag. The 3 degree axis was marked on the cornea.  A posterior chamber intraocular lens with the specifications above was inserted and positioned at the 3 degree axis. Irrigation-aspiration was used to remove all viscoelastic. Cefuroxime 1cc was instilled into the  anterior chamber, and the corneal incision was checked and found to be water tight. The eyelid speculum was removed.  The operative eye was covered with protective goggles after instilling 1 drop of Combigan. The patient tolerated the procedure well. There were no complications.

## 2016-01-17 NOTE — Transfer of Care (Signed)
Immediate Anesthesia Transfer of Care Note  Patient: Perry Taylor  Procedure(s) Performed: Procedure(s) with comments: CATARACT EXTRACTION PHACO AND INTRAOCULAR LENS PLACEMENT ([place]) (Right) - RIGHT MULTI FOCAL TORIC  Patient Location: PACU  Anesthesia Type: MAC  Level of Consciousness: awake, alert  and patient cooperative  Airway and Oxygen Therapy: Patient Spontanous Breathing and Patient connected to supplemental oxygen  Post-op Assessment: Post-op Vital signs reviewed, Patient's Cardiovascular Status Stable, Respiratory Function Stable, Patent Airway and No signs of Nausea or vomiting  Post-op Vital Signs: Reviewed and stable  Complications: No apparent anesthesia complications

## 2016-01-18 ENCOUNTER — Encounter: Payer: Self-pay | Admitting: Ophthalmology

## 2016-02-07 ENCOUNTER — Encounter: Payer: Self-pay | Admitting: *Deleted

## 2016-02-10 NOTE — Discharge Instructions (Signed)
Cataract Surgery, Care After °Refer to this sheet in the next few weeks. These instructions provide you with information about caring for yourself after your procedure. Your health care provider may also give you more specific instructions. Your treatment has been planned according to current medical practices, but problems sometimes occur. Call your health care provider if you have any problems or questions after your procedure. °What can I expect after the procedure? °After the procedure, it is common to have: °· Itching. °· Discomfort. °· Fluid discharge. °· Sensitivity to light and to touch. °· Bruising. °Follow these instructions at home: °Eye Care  °· Check your eye every day for signs of infection. Watch for: °¨ Redness, swelling, or pain. °¨ Fluid, blood, or pus. °¨ Warmth. °¨ Bad smell. °Activity  °· Avoid strenuous activities, such as playing contact sports, for as long as told by your health care provider. °· Do not drive or operate heavy machinery until your health care provider approves. °· Do not bend or lift heavy objects . Bending increases pressure in the eye. You can walk, climb stairs, and do light household chores. °· Ask your health care provider when you can return to work. If you work in a dusty environment, you may be advised to wear protective eyewear for a period of time. °General instructions  °· Take or apply over-the-counter and prescription medicines only as told by your health care provider. This includes eye drops. °· Do not touch or rub your eyes. °· If you were given a protective shield, wear it as told by your health care provider. If you were not given a protective shield, wear sunglasses as told by your health care provider to protect your eyes. °· Keep the area around your eye clean and dry. Avoid swimming or allowing water to hit you directly in the face while showering until told by your health care provider. Keep soap and shampoo out of your eyes. °· Do not put a contact lens  into the affected eye or eyes until your health care provider approves. °· Keep all follow-up visits as told by your health care provider. This is important. °Contact a health care provider if: ° °· You have increased bruising around your eye. °· You have pain that is not helped with medicine. °· You have a fever. °· You have redness, swelling, or pain in your eye. °· You have fluid, blood, or pus coming from your incision. °· Your vision gets worse. °Get help right away if: °· You have sudden vision loss. °This information is not intended to replace advice given to you by your health care provider. Make sure you discuss any questions you have with your health care provider. °Document Released: 07/15/2004 Document Revised: 05/06/2015 Document Reviewed: 11/05/2014 °Elsevier Interactive Patient Education © 2017 Else[employer] Inc[employer] ° ° ° ° °General Anesthesia, Adult, Care After °These instructions provide you with information about caring for yourself after your procedure. Your health care provider may also give you more specific instructions. Your treatment has been planned according to current medical practices, but problems sometimes occur. Call your health care provider if you have any problems or questions after your procedure. °What can I expect after the procedure? °After the procedure, it is common to have: °· Vomiting. °· A sore throat. °· Mental slowness. °It is common to feel: °· Nauseous. °· Cold or shivery. °· Sleepy. °· Tired. °· Sore or achy, even in parts of your body where you did not have surgery. °Follow these instructions at   home: °For at least 24 hours after the procedure:  °· Do not: °¨ Participate in activities where you could fall or become injured. °¨ Drive. °¨ Use heavy machinery. °¨ Drink alcohol. °¨ Take sleeping pills or medicines that cause drowsiness. °¨ Make important decisions or sign legal documents. °¨ Take care of children on your own. °· Rest. °Eating and drinking  °· If you vomit, drink  water, juice, or soup when you can drink without vomiting. °· Drink enough fluid to keep your urine clear or pale yellow. °· Make sure you have little or no nausea before eating solid foods. °· Follow the diet recommended by your health care provider. °General instructions  °· Have a responsible adult stay with you until you are awake and alert. °· Return to your normal activities as told by your health care provider. Ask your health care provider what activities are safe for you. °· Take over-the-counter and prescription medicines only as told by your health care provider. °· If you smoke, do not smoke without supervision. °· Keep all follow-up visits as told by your health care provider. This is important. °Contact a health care provider if: °· You continue to have nausea or vomiting at home, and medicines are not helpful. °· You cannot drink fluids or start eating again. °· You cannot urinate after 8-12 hours. °· You develop a skin rash. °· You have fever. °· You have increasing redness at the site of your procedure. °Get help right away if: °· You have difficulty breathing. °· You have chest pain. °· You have unexpected bleeding. °· You feel that you are having a life-threatening or urgent problem. °This information is not intended to replace advice given to you by your health care provider. Make sure you discuss any questions you have with your health care provider. °Document Released: 04/03/2000 Document Revised: 05/31/2015 Document Reviewed: 12/10/2014 °Elsevier Interactive Patient Education © 2017 Elsevier Inc. ° °

## 2016-02-14 ENCOUNTER — Ambulatory Visit: Payer: Medicare PPO | Admitting: Anesthesiology

## 2016-02-14 ENCOUNTER — Encounter
Admission: RE | Disposition: A | Discharge status: Home / Self Care | Payer: Self-pay | Source: Ambulatory Visit | Attending: Ophthalmology

## 2016-02-14 ENCOUNTER — Ambulatory Visit
Admission: RE | Admit: 2016-02-14 | Discharge: 2016-02-14 | Disposition: A | Discharge status: Home / Self Care | Payer: Medicare PPO | Source: Ambulatory Visit | Attending: Ophthalmology | Admitting: Ophthalmology

## 2016-02-14 DIAGNOSIS — Z87891 Personal history of nicotine dependence: Secondary | ICD-10-CM | POA: Diagnosis not present

## 2016-02-14 DIAGNOSIS — K219 Gastro-esophageal reflux disease without esophagitis: Secondary | ICD-10-CM | POA: Insufficient documentation

## 2016-02-14 DIAGNOSIS — H2512 Age-related nuclear cataract, left eye: Secondary | ICD-10-CM | POA: Insufficient documentation

## 2016-02-14 DIAGNOSIS — I10 Essential (primary) hypertension: Secondary | ICD-10-CM | POA: Diagnosis not present

## 2016-02-14 HISTORY — PX: CATARACT EXTRACTION W/PHACO: SHX586

## 2016-02-14 SURGERY — PHACOEMULSIFICATION, CATARACT, WITH IOL INSERTION
Anesthesia: Monitor Anesthesia Care | Laterality: Left | Wound class: Clean

## 2016-02-14 MED ORDER — BRIMONIDINE TARTRATE-TIMOLOL 0.2-0.5 % OP SOLN
OPHTHALMIC | Status: DC | PRN
  Administered 2016-02-14: 1 [drp] via OPHTHALMIC

## 2016-02-14 MED ORDER — EPINEPHRINE PF 1 MG/ML IJ SOLN
INTRAOCULAR | Status: DC | PRN
  Administered 2016-02-14: 65 mL via OPHTHALMIC

## 2016-02-14 MED ORDER — LIDOCAINE HCL (PF) 2 % IJ SOLN
INTRAMUSCULAR | Status: DC | PRN
  Administered 2016-02-14: .2 mL

## 2016-02-14 MED ORDER — MIDAZOLAM HCL 2 MG/2ML IJ SOLN
INTRAMUSCULAR | Status: DC | PRN
  Administered 2016-02-14: 2 mg via INTRAVENOUS

## 2016-02-14 MED ORDER — MOXIFLOXACIN HCL 0.5 % OP SOLN
1.0000 [drp] | OPHTHALMIC | Status: DC | PRN
  Administered 2016-02-14 (×3): 1 [drp] via OPHTHALMIC

## 2016-02-14 MED ORDER — CEFUROXIME OPHTHALMIC INJECTION 1 MG/0.1 ML
INJECTION | OPHTHALMIC | Status: DC | PRN
  Administered 2016-02-14: 0.1 mL via INTRACAMERAL

## 2016-02-14 MED ORDER — ARMC OPHTHALMIC DILATING DROPS
1.0000 | OPHTHALMIC | Status: DC | PRN
Start: 2016-02-14 — End: 2016-02-14
  Administered 2016-02-14 (×3): 1 via OPHTHALMIC

## 2016-02-14 MED ORDER — FENTANYL CITRATE (PF) 100 MCG/2ML IJ SOLN
INTRAMUSCULAR | Status: DC | PRN
  Administered 2016-02-14: 100 ug via INTRAVENOUS

## 2016-02-14 MED ORDER — NA HYALUR & NA CHOND-NA HYALUR 0.4-0.35 ML IO KIT
PACK | INTRAOCULAR | Status: DC | PRN
  Administered 2016-02-14: 1 mL via INTRAOCULAR

## 2016-02-14 SURGICAL SUPPLY — 22 items
APPLICATOR COTTON TIP 3IN (MISCELLANEOUS) ×2 IMPLANT
CANNULA ANT/CHMB 27GA (MISCELLANEOUS) ×2 IMPLANT
DISSECTOR HYDRO NUCLEUS 50X22 (MISCELLANEOUS) ×2 IMPLANT
GLOVE BIO SURGEON STRL SZ7 (GLOVE) ×2 IMPLANT
GLOVE SURG LX 6.5 MICRO (GLOVE) ×1
GLOVE SURG LX STRL 6.5 MICRO (GLOVE) ×1 IMPLANT
GOWN STRL REUS W/ TWL LRG LVL3 (GOWN DISPOSABLE) ×2 IMPLANT
GOWN STRL REUS W/TWL LRG LVL3 (GOWN DISPOSABLE) ×2
MARKER SKIN DUAL TIP RULER LAB (MISCELLANEOUS) ×2 IMPLANT
PACK CATARACT BRASINGTON (MISCELLANEOUS) ×2 IMPLANT
PACK EYE AFTER SURG (MISCELLANEOUS) ×2 IMPLANT
PACK OPTHALMIC (MISCELLANEOUS) ×2 IMPLANT
RING MALYGIN 7.0 (MISCELLANEOUS) IMPLANT
SUT ETHILON 10-0 CS-B-6CS-B-6 (SUTURE)
SUT VICRYL  9 0 (SUTURE)
SUT VICRYL 9 0 (SUTURE) IMPLANT
SUTURE EHLN 10-0 CS-B-6CS-B-6 (SUTURE) IMPLANT
SYR TB 1ML LUER SLIP (SYRINGE) ×2 IMPLANT
WATER STERILE IRR 250ML POUR (IV SOLUTION) ×2 IMPLANT
WICK EYE OCUCEL (MISCELLANEOUS) IMPLANT
WIPE NON LINTING 3.25X3.25 (MISCELLANEOUS) ×2 IMPLANT
tecnis symfony toric iol (Intraocular Lens) ×2 IMPLANT

## 2016-02-14 NOTE — Anesthesia Postprocedure Evaluation (Signed)
Anesthesia Post Note  Patient: Perry Taylor  Procedure(s) Performed: Procedure(s) (LRB): CATARACT EXTRACTION PHACO AND INTRAOCULAR LENS PLACEMENT (I[facility])  Left eye  Multi focal Toric (Left)  Patient location during evaluation: PACU Anesthesia Type: MAC Level of consciousness: awake and alert Pain management: pain level controlled Vital Signs Assessment: post-procedure vital signs reviewed and stable Respiratory status: spontaneous breathing, nonlabored ventilation, respiratory function stable and patient connected to nasal cannula oxygen Cardiovascular status: stable and blood pressure returned to baseline Anesthetic complications: no    Alisa Graff

## 2016-02-14 NOTE — Transfer of Care (Signed)
Immediate Anesthesia Transfer of Care Note  Patient: Perry Taylor  Procedure(s) Performed: Procedure(s): CATARACT EXTRACTION PHACO AND INTRAOCULAR LENS PLACEMENT ([facility])  Left eye  Multi focal Toric (Left)  Patient Location: PACU  Anesthesia Type: MAC  Level of Consciousness: awake, alert  and patient cooperative  Airway and Oxygen Therapy: Patient Spontanous Breathing and Patient connected to supplemental oxygen  Post-op Assessment: Post-op Vital signs reviewed, Patient's Cardiovascular Status Stable, Respiratory Function Stable, Patent Airway and No signs of Nausea or vomiting  Post-op Vital Signs: Reviewed and stable  Complications: No apparent anesthesia complications

## 2016-02-14 NOTE — H&P (Signed)
H+P reviewed and is up to date, please see paper chart.  

## 2016-02-14 NOTE — Anesthesia Preprocedure Evaluation (Signed)
Anesthesia Evaluation  Patient identified by MRN, date of birth, ID band Patient awake    Reviewed: Allergy & Precautions, H&P , NPO status , Patient's Chart, lab work & pertinent test results, reviewed documented beta blocker date and time   Airway Mallampati: II  TM Distance: >3 FB Neck ROM: full    Dental no notable dental hx.    Pulmonary former smoker,    Pulmonary exam normal breath sounds clear to auscultation       Cardiovascular Exercise Tolerance: Good hypertension,  Rhythm:regular Rate:Normal     Neuro/Psych negative neurological ROS  negative psych ROS   GI/Hepatic Neg liver ROS, GERD  ,  Endo/Other  negative endocrine ROS  Renal/GU negative Renal ROS  negative genitourinary   Musculoskeletal   Abdominal   Peds  Hematology negative hematology ROS (+)   Anesthesia Other Findings   Reproductive/Obstetrics negative OB ROS                             Anesthesia Physical Anesthesia Plan  ASA: II  Anesthesia Plan: MAC   Post-op Pain Management:    Induction:   Airway Management Planned:   Additional Equipment:   Intra-op Plan:   Post-operative Plan:   Informed Consent: I have reviewed the patients History and Physical, chart, labs and discussed the procedure including the risks, benefits and alternatives for the proposed anesthesia with the patient or authorized representative who has indicated his/her understanding and acceptance.   Dental Advisory Given  Plan Discussed with: CRNA  Anesthesia Plan Comments:         Anesthesia Quick Evaluation

## 2016-02-14 NOTE — Anesthesia Procedure Notes (Signed)
Procedure Name: MAC Performed by: Mayme Genta Pre-anesthesia Checklist: Patient identified, Emergency Drugs available, Suction available, Timeout performed and Patient being monitored Patient Re-evaluated:Patient Re-evaluated prior to inductionOxygen Delivery Method: Nasal cannula Placement Confirmation: positive ETCO2

## 2016-02-14 NOTE — Op Note (Signed)
Date of Surgery: 02/14/2016  PREOPERATIVE DIAGNOSES: Visually significant nuclear sclerotic cataract, left eye.  POSTOPERATIVE DIAGNOSES: Same  PROCEDURES PERFORMED: Cataract extraction with multifocal toric intraocular lens implant, left eye.  SURGEON: Almon Hercules, M.D.  ANESTHESIA: MAC and topical  IMPLANTS: [id number] oriented at 174 degree axis  Implant Name Type Inv. Item Serial No. Manufacturer Lot No. LRB No. Used  tecnis symfony toric iol Intraocular Lens   [id number] [employer]   Left 1    COMPLICATIONS: None.  DESCRIPTION OF PROCEDURE: Therapeutic options were discussed with the patient preoperatively, including a discussion of risks and benefits of surgery. Informed consent was obtained. An IOL-Master and immersion biometry were used to take the lens measurements, and a dilated fundus exam was performed within 6 months of the surgical date.  The patient was premedicated and brought to the operating room and placed on the operating table in the supine position. After adequate anesthesia, the patient was prepped and draped in the usual sterile ophthalmic fashion. A wire lid speculum was inserted and the microscope was positioned. A Superblade was used to create a paracentesis site at the limbus and a small amount of dilute preservative free lidocaine was instilled into the anterior chamber, followed by dispersive viscoelastic. A clear corneal incision was created temporally using a 2.4 mm keratome blade. Capsulorrhexis was then performed. In situ phacoemulsification was performed.  Cortical material was removed with the irrigation-aspiration unit. Dispersive viscoelastic was instilled to open the capsular bag. The correct corneal axis was marked.  A posterior chamber intraocular lens with the specifications above was inserted and positioned. Irrigation-aspiration was used to remove all viscoelastic. Cefuroxime 1cc was instilled into the anterior chamber, and the corneal incision  was checked and found to be water tight. The eyelid speculum was removed.  The operative eye was covered with protective goggles after instilling 1 drop of timolol and brimonidine. The patient tolerated the procedure well. There were no complications.

## 2016-02-15 ENCOUNTER — Encounter: Payer: Self-pay | Admitting: Ophthalmology

## 2018-02-19 ENCOUNTER — Encounter: Payer: Self-pay | Admitting: Student

## 2018-02-19 ENCOUNTER — Ambulatory Visit
Admission: RE | Admit: 2018-02-19 | Discharge: 2018-02-19 | Disposition: A | Discharge status: Home / Self Care | Payer: Medicare PPO | Attending: Gastroenterology | Admitting: Gastroenterology

## 2018-02-19 ENCOUNTER — Ambulatory Visit: Payer: Medicare PPO | Admitting: Anesthesiology

## 2018-02-19 ENCOUNTER — Other Ambulatory Visit: Payer: Self-pay

## 2018-02-19 ENCOUNTER — Encounter
Admission: RE | Disposition: A | Discharge status: Home / Self Care | Payer: Self-pay | Source: Home / Self Care | Attending: Gastroenterology

## 2018-02-19 DIAGNOSIS — Z87891 Personal history of nicotine dependence: Secondary | ICD-10-CM | POA: Diagnosis not present

## 2018-02-19 DIAGNOSIS — K921 Melena: Secondary | ICD-10-CM | POA: Diagnosis not present

## 2018-02-19 DIAGNOSIS — I10 Essential (primary) hypertension: Secondary | ICD-10-CM | POA: Insufficient documentation

## 2018-02-19 DIAGNOSIS — K317 Polyp of stomach and duodenum: Secondary | ICD-10-CM | POA: Insufficient documentation

## 2018-02-19 DIAGNOSIS — R1032 Left lower quadrant pain: Secondary | ICD-10-CM | POA: Insufficient documentation

## 2018-02-19 DIAGNOSIS — K5730 Diverticulosis of large intestine without perforation or abscess without bleeding: Secondary | ICD-10-CM | POA: Insufficient documentation

## 2018-02-19 DIAGNOSIS — K210 Gastro-esophageal reflux disease with esophagitis: Secondary | ICD-10-CM | POA: Diagnosis not present

## 2018-02-19 DIAGNOSIS — Z7951 Long term (current) use of inhaled steroids: Secondary | ICD-10-CM | POA: Diagnosis not present

## 2018-02-19 DIAGNOSIS — Z79899 Other long term (current) drug therapy: Secondary | ICD-10-CM | POA: Diagnosis not present

## 2018-02-19 DIAGNOSIS — K2970 Gastritis, unspecified, without bleeding: Secondary | ICD-10-CM | POA: Diagnosis not present

## 2018-02-19 DIAGNOSIS — E785 Hyperlipidemia, unspecified: Secondary | ICD-10-CM | POA: Diagnosis not present

## 2018-02-19 DIAGNOSIS — R1013 Epigastric pain: Secondary | ICD-10-CM | POA: Insufficient documentation

## 2018-02-19 HISTORY — PX: COLONOSCOPY WITH PROPOFOL: SHX5780

## 2018-02-19 HISTORY — PX: ESOPHAGOGASTRODUODENOSCOPY (EGD) WITH PROPOFOL: SHX5813

## 2018-02-19 SURGERY — ESOPHAGOGASTRODUODENOSCOPY (EGD) WITH PROPOFOL
Anesthesia: General

## 2018-02-19 MED ORDER — PROPOFOL 10 MG/ML IV BOLUS
INTRAVENOUS | Status: DC | PRN
  Administered 2018-02-19: 50 mg via INTRAVENOUS

## 2018-02-19 MED ORDER — MIDAZOLAM HCL 2 MG/2ML IJ SOLN
INTRAMUSCULAR | Status: DC | PRN
  Administered 2018-02-19: 1 mg via INTRAVENOUS

## 2018-02-19 MED ORDER — LIDOCAINE HCL (PF) 1 % IJ SOLN
INTRAMUSCULAR | Status: AC
  Administered 2018-02-19: 0.3 mL
  Filled 2018-02-19: qty 2

## 2018-02-19 MED ORDER — FENTANYL CITRATE (PF) 100 MCG/2ML IJ SOLN
INTRAMUSCULAR | Status: AC
  Filled 2018-02-19: qty 2

## 2018-02-19 MED ORDER — PROPOFOL 500 MG/50ML IV EMUL
INTRAVENOUS | Status: DC | PRN
  Administered 2018-02-19: 150 ug/kg/min via INTRAVENOUS

## 2018-02-19 MED ORDER — MIDAZOLAM HCL 2 MG/2ML IJ SOLN
INTRAMUSCULAR | Status: AC
  Filled 2018-02-19: qty 2

## 2018-02-19 MED ORDER — SODIUM CHLORIDE 0.9 % IV SOLN
INTRAVENOUS | Status: DC
  Administered 2018-02-19: 1000 mL via INTRAVENOUS

## 2018-02-19 MED ORDER — EPHEDRINE SULFATE 50 MG/ML IJ SOLN
INTRAMUSCULAR | Status: DC | PRN
  Administered 2018-02-19: 15 mg via INTRAVENOUS

## 2018-02-19 MED ORDER — FENTANYL CITRATE (PF) 100 MCG/2ML IJ SOLN
INTRAMUSCULAR | Status: DC | PRN
  Administered 2018-02-19: 50 ug via INTRAVENOUS

## 2018-02-19 MED ORDER — PROPOFOL 500 MG/50ML IV EMUL
INTRAVENOUS | Status: AC
  Filled 2018-02-19: qty 50

## 2018-02-19 MED ORDER — LIDOCAINE HCL (CARDIAC) PF 100 MG/5ML IV SOSY
PREFILLED_SYRINGE | INTRAVENOUS | Status: DC | PRN
  Administered 2018-02-19: 80 mg via INTRAVENOUS

## 2018-02-19 NOTE — Op Note (Addendum)
[facility] Gastroenterology Patient Name: Perry Taylor Procedure Date: 02/19/2018 10:56 AM MRN: [id number] Account #: [id number] Date of Birth: [birth year] Admit Type: Outpatient Age: 73 Room: [facility] ENDO ROOM 2 Gender: Male Note Status: Finalized Procedure:            Colonoscopy Indications:          Abdominal pain in the left lower quadrant Providers:            Christena Deem, MD Referring MD:         Nat Christen. Zada Finders, MD (Referring MD) Medicines:            Monitored Anesthesia Care Complications:        No immediate complications. Procedure:            Pre-Anesthesia Assessment:                       - ASA Grade Assessment: III - A patient with severe                        systemic disease.                       After obtaining informed consent, the colonoscope was                        passed under direct vision. Throughout the procedure,                        the patient's blood pressure, pulse, and oxygen                        saturations were monitored continuously. The                        Colonoscope was introduced through the anus and                        advanced to the the cecum, identified by appendiceal                        orifice and ileocecal valve. The colonoscopy was                        performed without difficulty. The patient tolerated the                        procedure well. The quality of the bowel preparation                        was good. Findings:      Multiple small to medium-mouthed diverticula were found in the sigmoid       colon and distal descending colon.      On removal of the scope, two medium/small-mouthed diverticula were found       at 30 cm proximal to the anus and at 34 cm proximal to the anus.       Purulent discharge was seen in association with the diverticular       opening, suspicious of diverticulitis.      There was no peridiverticular erythema.      The exam was otherwise without  abnormality.      The retroflexed view of the distal rectum and anal verge was normal and       showed no anal or rectal abnormalities.      The digital rectal exam was normal. Impression:           - Diverticulosis in the sigmoid colon and in the distal                        descending colon.                       - Mild diverticulosis at 30 cm proximal to the anus and                        at 34 cm proximal to the anus. Purulent discharge was                        seen in association with the diverticular opening,                        suspicious of diverticulitis.                       - The examination was otherwise normal.                       - The distal rectum and anal verge are normal on                        retroflexion view.                       - No specimens collected. Recommendation:       - Cipro (ciprofloxacin) 500 mg PO BID for 7 days.                       - Flagyl (metronidazole) 250 mg PO QID for 1 week.                       - Low/soft residue diet for 5 days. Procedure Code(s):    --- Professional ---                       [phone], Colonoscopy, flexible; diagnostic, including                        collection of specimen(s) by brushing or washing, when                        performed (separate procedure) Diagnosis Code(s):    --- Professional ---                       R10.32, Left lower quadrant pain                       K57.30, Diverticulosis of large intestine without                        perforation or abscess without bleeding CPT copyright 2018 American Medical Association. All rights reserved. The codes documented in this report are preliminary and  upon coder review may  be revised to meet current compliance requirements. Christena Deem, MD 02/19/2018 12:26:47 PM This report has been signed electronically. Number of Addenda: 0 Note Initiated On: 02/19/2018 10:56 AM Scope Withdrawal Time: 0 hours 9 minutes 51 seconds  Total Procedure Duration: 0  hours 22 minutes 30 seconds       [facility]

## 2018-02-19 NOTE — Anesthesia Preprocedure Evaluation (Signed)
Anesthesia Evaluation  Patient identified by MRN, date of birth, ID band Patient awake    Reviewed: Allergy & Precautions, H&P , NPO status , Patient's Chart, lab work & pertinent test results  History of Anesthesia Complications Negative for: history of anesthetic complications  Airway Mallampati: III  TM Distance: >3 FB Neck ROM: limited    Dental  (+) Chipped   Pulmonary neg shortness of breath, former smoker,           Cardiovascular Exercise Tolerance: Good hypertension, (-) angina(-) Past MI and (-) DOE      Neuro/Psych negative neurological ROS  negative psych ROS   GI/Hepatic Neg liver ROS, GERD  Medicated and Controlled,  Endo/Other  negative endocrine ROS  Renal/GU negative Renal ROS  negative genitourinary   Musculoskeletal  (+) Arthritis ,   Abdominal   Peds  Hematology negative hematology ROS (+)   Anesthesia Other Findings Past Medical History: No date: Arthritis     Comment:  fingers No date: GERD (gastroesophageal reflux disease) No date: Hyperlipidemia No date: Hypertension No date: Motion sickness     Comment:  boats, car-back seat  Past Surgical History: 01/17/2016: CATARACT EXTRACTION W/PHACO; Right     Comment:  Procedure: CATARACT EXTRACTION PHACO AND INTRAOCULAR               LENS PLACEMENT (I[facility]);  Surgeon: Sherald Hess,              MD;  Location: [facility];  Service:               Ophthalmology;  Laterality: Right;  RIGHT MULTI FOCAL               TORIC 02/14/2016: CATARACT EXTRACTION W/PHACO; Left     Comment:  Procedure: CATARACT EXTRACTION PHACO AND INTRAOCULAR               LENS PLACEMENT (I[facility])  Left eye  Multi focal Toric;                Surgeon: Sherald Hess, MD;  Location: [facility]               SUR[facility]ERY [facility];  Service: Ophthalmology;  Laterality: Left; No date: COLONOSCOPY No date: ESOPHAGOGASTRODUODENOSCOPY 10/07/2014:  ESOPHAGOGASTRODUODENOSCOPY; N/A     Comment:  Procedure: ESOPHAGOGASTRODUODENOSCOPY (EGD);  Surgeon:               Wallace Cullens, MD;  Location: [facility];  Service:               Gastroenterology;  Laterality: N/A; No date: HEMORRHOID SURGERY  BMI    Body Mass Index:  26.40 kg/m      Reproductive/Obstetrics negative OB ROS                             Anesthesia Physical Anesthesia Plan  ASA: III  Anesthesia Plan: General   Post-op Pain Management:    Induction: Intravenous  PONV Risk Score and Plan: Propofol infusion and TIVA  Airway Management Planned: Natural Airway and Nasal Cannula  Additional Equipment:   Intra-op Plan:   Post-operative Plan:   Informed Consent: I have reviewed the patients History and Physical, chart, labs and discussed the procedure including the risks, benefits and alternatives for the proposed anesthesia with the patient or authorized representative who has indicated his/her understanding and acceptance.     Dental Advisory Given  Plan Discussed  with: Anesthesiologist, CRNA and Surgeon  Anesthesia Plan Comments: (Patient consented for risks of anesthesia including but not limited to:  - adverse reactions to medications - risk of intubation if required - damage to teeth, lips or other oral mucosa - sore throat or hoarseness - Damage to heart, brain, lungs or loss of life  Patient voiced understanding.)        Anesthesia Quick Evaluation

## 2018-02-19 NOTE — Op Note (Addendum)
[facility] Gastroenterology Patient Name: Perry Taylor Procedure Date: 02/19/2018 10:57 AM MRN: [id number] Account #: [id number] Date of Birth: [birth year] Admit Type: Outpatient Age: 73 Room: [facility] ENDO ROOM 2 Gender: Male Note Status: Finalized Procedure:            Upper GI endoscopy Indications:          Epigastric abdominal pain, Melena Providers:            Christena Deem, MD Referring MD:         Nat Christen. Zada Finders, MD (Referring MD) Medicines:            Monitored Anesthesia Care Complications:        No immediate complications. Procedure:            Pre-Anesthesia Assessment:                       - ASA Grade Assessment: III - A patient with severe                        systemic disease.                       After obtaining informed consent, the endoscope was                        passed under direct vision. Throughout the procedure,                        the patient's blood pressure, pulse, and oxygen                        saturations were monitored continuously. The Endoscope                        was introduced through the mouth, and advanced to the                        third part of duodenum. The upper GI endoscopy was                        accomplished without difficulty. The patient tolerated                        the procedure well. Findings:      The Z-line was irregular and was found 35 cm from the incisors. Biopsies       were taken with a cold forceps for histology from 35 and 34 cm from the       incisors, placed into separate jars, rule out Barretts esophagus..      The exam of the esophagus was otherwise normal.      Patchy mild inflammation characterized by congestion (edema) and       erythema was found in the gastric body and in the gastric antrum.       Biopsies were taken with a cold forceps for histology antrum and body       placed into separate jars.      The cardia and gastric fundus were normal on retroflexion.  Minimal inflammation characterized by erythema was found in the duodenal       bulb. Impression:           -  Z-line irregular, 35/34 cm from the incisors.                        Biopsied.                       - Bile gastritis. Biopsied. Recommendation:       - Use Protonix (pantoprazole) 40 mg PO daily daily.                       - Await pathology results.                       - Return to GI clinic in 4 weeks. Procedure Code(s):    --- Professional ---                       [phone], Esophagogastroduodenoscopy, flexible, transoral;                        with biopsy, single or multiple Diagnosis Code(s):    --- Professional ---                       K22.8, Other specified diseases of esophagus                       K29.60, Other gastritis without bleeding                       R10.13, Epigastric pain                       K92.1, Melena (includes Hematochezia) CPT copyright 2018 American Medical Association. All rights reserved. The codes documented in this report are preliminary and upon coder review may  be revised to meet current compliance requirements. Christena Deem, MD 02/19/2018 11:49:44 AM This report has been signed electronically. Number of Addenda: 0 Note Initiated On: 02/19/2018 10:57 AM      [facility]

## 2018-02-19 NOTE — Transfer of Care (Signed)
Immediate Anesthesia Transfer of Care Note  Patient: Perry Taylor  Procedure(s) Performed: ESOPHAGOGASTRODUODENOSCOPY (EGD) WITH PROPOFOL (N/A ) COLONOSCOPY WITH PROPOFOL (N/A )  Patient Location: PACU  Anesthesia Type:General  Level of Consciousness: awake  Airway & Oxygen Therapy: Patient Spontanous Breathing  Post-op Assessment: Report given to RN and Post -op Vital signs reviewed and stable  Post vital signs: Reviewed and stable  Last Vitals:  Vitals Value Taken Time  BP    Temp 36 C 02/19/2018 12:19 PM  Pulse 68 02/19/2018 12:19 PM  Resp 12 02/19/2018 12:19 PM  SpO2 100 % 02/19/2018 12:19 PM    Last Pain:  Vitals:   02/19/18 1219  TempSrc: Tympanic  PainSc: 0-No pain         Complications: No apparent anesthesia complications

## 2018-02-19 NOTE — Anesthesia Post-op Follow-up Note (Signed)
Anesthesia QCDR form completed.        

## 2018-02-19 NOTE — Anesthesia Procedure Notes (Signed)
Date/Time: 02/19/2018 11:15 AM Performed by: Henrietta Hoover, CRNA Pre-anesthesia Checklist: Patient identified, Emergency Drugs available, Suction available, Patient being monitored and Timeout performed Oxygen Delivery Method: Nasal cannula Placement Confirmation: positive ETCO2

## 2018-02-19 NOTE — H&P (Addendum)
Outpatient short stay form Pre-procedure 02/19/2018 11:14 AM Christena DeemMartin U Atiana Levier MD  Primary Physician: Dr. Rolin BarryMario Olmedo  Reason for visit: EGD and colonoscopy  History of present illness: Patient is a 73 year old male presenting today for an EGD and colonoscopy in regards to some left lower quadrant pain epigastric pain dyspepsia and some black stools.  These episodes occurred out 3 to 4 weeks ago.  He presented to his primary physician with some left lower quadrant pain and some black stools.  He was treated empirically for diverticulitis with a completion of his antibiotic course about 10 days ago.  States that he continues to have some mild discomfort in the left lower quadrant however on further discussion and examination today he had no pain.  He tolerated his prep well.  He takes no aspirin or blood thinning agent.  However he does take tumeric.    Current Facility-Administered Medications:  .  0.9 %  sodium chloride infusion, , Intravenous, Continuous, Christena DeemSkulskie, Ysabella Babiarz U, MD, Last Rate: 20 mL/hr at 02/19/18 1053, 1,000 mL at 02/19/18 1053  Medications Prior to Admission  Medication Sig Dispense Refill Last Dose  . atorvastatin (LIPITOR) 20 MG tablet Take by mouth.   Past Week at Unknown time  . chlorthalidone (HYGROTON) 25 MG tablet    Past Week at Unknown time  . fluticasone (FLONASE) 50 MCG/ACT nasal spray Place 2 sprays into both nostrils daily.     Marland Kitchen. latanoprost (XALATAN) 0.005 % ophthalmic solution Place 1 drop into both eyes at bedtime.     Marland Kitchen. lisinopril (PRINIVIL,ZESTRIL) 20 MG tablet    02/18/2018 at Unknown time  . Multiple Vitamins-Minerals (MULTIVITAMIN PO) Take by mouth.   Past Week at Unknown time  . Omega-3 Fatty Acids (FISH OIL PO) Take by mouth.   Past Week at Unknown time  . pantoprazole (PROTONIX) 40 MG tablet Take 40 mg by mouth daily. AM   02/19/2018 at Unknown time  . psyllium (METAMUCIL) 58.6 % packet Take 1 packet by mouth daily.   Past Week at Unknown time  .  UNABLE TO FIND Prost X. 1 tab 3 times daily   Past Week at Unknown time  . UNABLE TO FIND Symplex M. 1 tab 3 times daily   Past Week at Unknown time     Allergies  Allergen Reactions  . Flomax [Tamsulosin] Itching     Past Medical History:  Diagnosis Date  . Arthritis    fingers  . GERD (gastroesophageal reflux disease)   . Hyperlipidemia   . Hypertension   . Motion sickness    boats, car-back seat    Review of systems:      Physical Exam    Heart and lungs: Regular rate and rhythm without rub or gallop, lungs are bilaterally clear.    HEENT: Normocephalic atraumatic eyes are anicteric    Other:    Pertinant exam for procedure: Soft nontender nondistended bowel sounds positive normoactive    Planned proceedures: EGD, colonoscopy and indicated procedures. I have discussed the risks benefits and complications of procedures to include not limited to bleeding, infection, perforation and the risk of sedation and the patient wishes to proceed..  Discussed with patient and his wife that if I find that the: Is still inflamed  we may need to hold the completion of the colonoscopy today.    Christena DeemMartin U Mykeisha Dysert, MD Gastroenterology 02/19/2018  11:14 AM

## 2018-02-20 ENCOUNTER — Encounter: Payer: Self-pay | Admitting: Gastroenterology

## 2018-02-20 LAB — SURGICAL PATHOLOGY

## 2018-02-20 NOTE — Anesthesia Postprocedure Evaluation (Signed)
Anesthesia Post Note  Patient: Perry Taylor  Procedure(s) Performed: ESOPHAGOGASTRODUODENOSCOPY (EGD) WITH PROPOFOL (N/A ) COLONOSCOPY WITH PROPOFOL (N/A )  Patient location during evaluation: Endoscopy Anesthesia Type: General Level of consciousness: awake and alert Pain management: pain level controlled Vital Signs Assessment: post-procedure vital signs reviewed and stable Respiratory status: spontaneous breathing, nonlabored ventilation, respiratory function stable and patient connected to nasal cannula oxygen Cardiovascular status: blood pressure returned to baseline and stable Postop Assessment: no apparent nausea or vomiting Anesthetic complications: no     Last Vitals:  Vitals:   02/19/18 1259 02/19/18 1309  BP: (!) 150/79 (!) 141/80  Pulse: 64   Resp: 15   Temp:    SpO2: 99%     Last Pain:  Vitals:   02/19/18 1259  TempSrc:   PainSc: 0-No pain                 Lenard Simmer

## 2018-03-18 ENCOUNTER — Other Ambulatory Visit: Payer: Self-pay

## 2018-03-18 ENCOUNTER — Ambulatory Visit
Admission: RE | Admit: 2018-03-18 | Discharge: 2018-03-18 | Disposition: A | Discharge status: Home / Self Care | Payer: Medicare PPO | Attending: Gastroenterology | Admitting: Gastroenterology

## 2018-03-18 ENCOUNTER — Ambulatory Visit
Admission: RE | Admit: 2018-03-18 | Discharge: 2018-03-18 | Disposition: A | Discharge status: Home / Self Care | Payer: Medicare PPO | Source: Ambulatory Visit | Attending: Gastroenterology | Admitting: Gastroenterology

## 2018-03-18 ENCOUNTER — Other Ambulatory Visit: Payer: Self-pay | Admitting: Gastroenterology

## 2018-03-18 DIAGNOSIS — R1032 Left lower quadrant pain: Secondary | ICD-10-CM | POA: Diagnosis present

## 2018-07-17 ENCOUNTER — Other Ambulatory Visit: Payer: Self-pay

## 2018-07-17 ENCOUNTER — Emergency Department: Payer: Medicare PPO

## 2018-07-17 ENCOUNTER — Encounter: Payer: Self-pay | Admitting: Emergency Medicine

## 2018-07-17 ENCOUNTER — Emergency Department
Admission: EM | Admit: 2018-07-17 | Discharge: 2018-07-17 | Disposition: A | Discharge status: Home / Self Care | Payer: Medicare PPO | Attending: Emergency Medicine | Admitting: Emergency Medicine

## 2018-07-17 DIAGNOSIS — Z87891 Personal history of nicotine dependence: Secondary | ICD-10-CM | POA: Insufficient documentation

## 2018-07-17 DIAGNOSIS — B349 Viral infection, unspecified: Secondary | ICD-10-CM | POA: Insufficient documentation

## 2018-07-17 DIAGNOSIS — Z20828 Contact with and (suspected) exposure to other viral communicable diseases: Secondary | ICD-10-CM | POA: Insufficient documentation

## 2018-07-17 DIAGNOSIS — M7918 Myalgia, other site: Secondary | ICD-10-CM | POA: Insufficient documentation

## 2018-07-17 DIAGNOSIS — R51 Headache: Secondary | ICD-10-CM | POA: Diagnosis not present

## 2018-07-17 DIAGNOSIS — R5383 Other fatigue: Secondary | ICD-10-CM | POA: Diagnosis not present

## 2018-07-17 DIAGNOSIS — I10 Essential (primary) hypertension: Secondary | ICD-10-CM | POA: Insufficient documentation

## 2018-07-17 DIAGNOSIS — R509 Fever, unspecified: Secondary | ICD-10-CM | POA: Diagnosis present

## 2018-07-17 LAB — CBC WITH DIFFERENTIAL/PLATELET
Abs Immature Granulocytes: 0.04 10*3/uL (ref 0.00–0.07)
Basophils Absolute: 0 10*3/uL (ref 0.0–0.1)
Basophils Relative: 1 %
Eosinophils Absolute: 0 10*3/uL (ref 0.0–0.5)
Eosinophils Relative: 0 %
HCT: 44.6 % (ref 39.0–52.0)
Hemoglobin: 15.7 g/dL (ref 13.0–17.0)
Immature Granulocytes: 1 %
Lymphocytes Relative: 24 %
Lymphs Abs: 1.2 10*3/uL (ref 0.7–4.0)
MCH: 29 pg (ref 26.0–34.0)
MCHC: 35.2 g/dL (ref 30.0–36.0)
MCV: 82.3 fL (ref 80.0–100.0)
Monocytes Absolute: 0.4 10*3/uL (ref 0.1–1.0)
Monocytes Relative: 9 %
Neutro Abs: 3.4 10*3/uL (ref 1.7–7.7)
Neutrophils Relative %: 65 %
Platelets: 145 10*3/uL — ABNORMAL LOW (ref 150–400)
RBC: 5.42 MIL/uL (ref 4.22–5.81)
RDW: 13.1 % (ref 11.5–15.5)
WBC: 5.2 10*3/uL (ref 4.0–10.5)
nRBC: 0 % (ref 0.0–0.2)

## 2018-07-17 LAB — SARS CORONAVIRUS 2 BY RT PCR (HOSPITAL ORDER, PERFORMED IN ~~LOC~~ HOSPITAL LAB): SARS Coronavirus 2: NEGATIVE

## 2018-07-17 LAB — URINALYSIS, ROUTINE W REFLEX MICROSCOPIC
Bilirubin Urine: NEGATIVE
Glucose, UA: NEGATIVE mg/dL
Ketones, ur: NEGATIVE mg/dL
Leukocytes,Ua: NEGATIVE
Nitrite: NEGATIVE
Protein, ur: NEGATIVE mg/dL
Specific Gravity, Urine: 1.002 — ABNORMAL LOW (ref 1.005–1.030)
Squamous Epithelial / HPF: NONE SEEN (ref 0–5)
pH: 7 (ref 5.0–8.0)

## 2018-07-17 LAB — BASIC METABOLIC PANEL
Anion gap: 8 (ref 5–15)
BUN: 9 mg/dL (ref 8–23)
CO2: 27 mmol/L (ref 22–32)
Calcium: 9.1 mg/dL (ref 8.9–10.3)
Chloride: 99 mmol/L (ref 98–111)
Creatinine, Ser: 0.95 mg/dL (ref 0.61–1.24)
GFR calc Af Amer: >60 mL/min (ref >60)
GFR calc non Af Amer: >60 mL/min (ref >60)
Glucose, Bld: 124 mg/dL — ABNORMAL HIGH (ref 70–99)
Potassium: 3.4 mmol/L — ABNORMAL LOW (ref 3.5–5.1)
Sodium: 134 mmol/L — ABNORMAL LOW (ref 135–145)

## 2018-07-17 NOTE — ED Provider Notes (Signed)
[facility] Emergency Department Provider Note  ____________________________________________   First MD Initiated Contact with Patient 07/17/18 1122     (approximate)  I have reviewed the triage vital signs and the nursing notes.   HISTORY  Chief Complaint Fever and Headache    HPI Perry Taylor is a 73 y.o. male with hypertension, hyperlipidemia who presents for fever and headache.  Patient said he has had intermittent fevers for the past few days.  His fever as high as 101 this morning.  He took Tylenol at 7 AM and his fever has since come down.  Nothing makes the fever come on.  Fever is moderate in nature.  Patient has some associated fatigue, muscle aches, frontal headache.  Currently these are all resolved right now.  Patient says he tried to go see his primary care doctor but was unable to follow-up with them given they wanted coronavirus testing.          Past Medical History:  Diagnosis Date  . Arthritis    fingers  . GERD (gastroesophageal reflux disease)   . Hyperlipidemia   . Hypertension   . Motion sickness    boats, car-back seat    Patient Active Problem List   Diagnosis Date Noted  . Bruising 06/11/2015    Past Surgical History:  Procedure Laterality Date  . CATARACT EXTRACTION W/PHACO Right 01/17/2016   Procedure: CATARACT EXTRACTION PHACO AND INTRAOCULAR LENS PLACEMENT (IOC);  Surgeon: Sherald HessAnita Prakash Vin-Parikh, MD;  [facility];  Service: Ophthalmology;  Laterality: Right;  RIGHT MULTI FOCAL TORIC  . CATARACT EXTRACTION W/PHACO Left 02/14/2016   Procedure: CATARACT EXTRACTION PHACO AND INTRAOCULAR LENS PLACEMENT (IOC)  Left eye  Multi focal Toric;  Surgeon: Sherald HessAnita Prakash Vin-Parikh, MD;  [facility];  Service: Ophthalmology;  Laterality: Left;  . COLONOSCOPY    . COLONOSCOPY WITH PROPOFOL N/A 02/19/2018   Procedure: COLONOSCOPY WITH PROPOFOL;  Surgeon: Christena DeemSkulskie, Martin U, MD;  Location[facility]  [facility];  Service: Endoscopy;  Laterality: N/A;  . ESOPHAGOGASTRODUODENOSCOPY    . ESOPHAGOGASTRODUODENOSCOPY N/A 10/07/2014   Procedure: ESOPHAGOGASTRODUODENOSCOPY (EGD);  Surgeon: Wallace CullensPaul Y Oh, MD;  [facility];  Service: Gastroenterology;  Laterality: N/A;  . ESOPHAGOGASTRODUODENOSCOPY (EGD) WITH PROPOFOL N/A 02/19/2018   Procedure: ESOPHAGOGASTRODUODENOSCOPY (EGD) WITH PROPOFOL;  Surgeon: Christena DeemSkulskie, Martin U, MD;  [facility];  Service: Endoscopy;  Laterality: N/A;  . HEMORRHOID SURGERY      Prior to Admission medications   Medication Sig Start Date End Date Taking? Authorizing Provider  atorvastatin (LIPITOR) 20 MG tablet Take by mouth.    [provider]  chlorthalidone (HYGROTON) 25 MG tablet  03/23/15   [provider]  fluticasone (FLONASE) 50 MCG/ACT nasal spray Place 2 sprays into both nostrils daily.    [provider]  latanoprost (XALATAN) 0.005 % ophthalmic solution Place 1 drop into both eyes at bedtime.    [provider]  lisinopril (PRINIVIL,ZESTRIL) 20 MG tablet  03/23/15   [provider]  Multiple Vitamins-Minerals (MULTIVITAMIN PO) Take by mouth.    [provider]  Omega-3 Fatty Acids (FISH OIL PO) Take by mouth.    [provider]  pantoprazole (PROTONIX) 40 MG tablet Take 40 mg by mouth daily. AM    [provider]  psyllium (METAMUCIL) 58.6 % packet Take 1 packet by mouth daily.    [provider]  UNABLE TO FIND Prost X. 1 tab 3 times daily    [provider]  UNABLE TO FIND Symplex M. 1 tab 3 times daily    [provider]    Allergies Flomax [tamsulosin]  No family history on file.  Social History Social History   Tobacco Use  . Smoking status: Former Smoker    Packs/day: 1.00    Years: 15.00    Pack years: 15.00    Types: Cigarettes  . Smokeless tobacco: Former Systems developer    Quit date: 06/11/1983  . Tobacco comment: Quit 32 yrs  ago  Substance Use Topics  . Alcohol use: No    Alcohol/week: 0.0 standard drinks  . Drug use: Never      Review of Systems Constitutional: Positive for fevers Eyes: No visual changes. ENT: No sore throat. Cardiovascular: Denies chest pain. Respiratory: Denies shortness of breath. Gastrointestinal: No abdominal pain.  No nausea, no vomiting.  No diarrhea.  No constipation. Genitourinary: Negative for dysuria. Musculoskeletal: Negative for back pain.  Positive for muscle aches Skin: Negative for rash. Neurological: Positive for headache, focal weakness or numbness. All other ROS negative ____________________________________________   PHYSICAL EXAM:  VITAL SIGNS: ED Triage Vitals  Enc Vitals Group     BP 07/17/18 1118 127/82     Pulse Rate 07/17/18 1118 73     Resp 07/17/18 1118 16     Temp 07/17/18 1118 98 F (36.7 C)     Temp Source 07/17/18 1118 Oral     SpO2 07/17/18 1118 99 %     Weight 07/17/18 1115 184 lb 1.4 oz (83.5 kg)     Height 07/17/18 1115 5\' 10"  (1.778 m)     Head Circumference --      Peak Flow --      Pain Score 07/17/18 1115 0     Pain Loc --      Pain Edu? --      Excl. in [place]? --     Constitutional: Alert and oriented. Well appearing and in no acute distress. Eyes: Conjunctivae are normal. EOMI. Head: Atraumatic. Nose: No congestion/rhinnorhea. Mouth/Throat: Mucous membranes are moist.   Neck: No stridor. Trachea Midline. FROM Cardiovascular: Normal rate, regular rhythm. Grossly normal heart sounds.  Good peripheral circulation. Respiratory: Normal respiratory effort.  No retractions. Lungs CTAB. Gastrointestinal: Soft and nontender. No distention. No abdominal bruits.  Musculoskeletal: No lower extremity tenderness nor edema.  No joint effusions. Neurologic:  Normal speech and language. No gross focal neurologic deficits are appreciated.  Skin:  Skin is warm, dry and intact. No rash noted. Psychiatric: Mood and affect are normal. Speech and  behavior are normal. GU: Deferred   ____________________________________________   LABS (all labs ordered are listed, but only abnormal results are displayed)  Labs Reviewed  CBC WITH DIFFERENTIAL/PLATELET - Abnormal; Notable for the following components:      Result Value   Platelets 145 (*)    All other components within normal limits  BASIC METABOLIC PANEL - Abnormal; Notable for the following components:   Sodium 134 (*)    Potassium 3.4 (*)    Glucose, Bld 124 (*)    All other components within normal limits  URINALYSIS, ROUTINE W REFLEX MICROSCOPIC - Abnormal; Notable for the following components:   Color, Urine STRAW (*)    APPearance CLEAR (*)    Specific Gravity, Urine 1.002 (*)    Hgb urine dipstick MODERATE (*)    Bacteria, UA RARE (*)    All other components within normal limits  SARS CORONAVIRUS 2 (HOSPITAL ORDER, PERFORMED IN  [facility])   ____________________________________________   RADIOLOGY Vela ProseI, Jayquon Theiler E Axl Rodino, personally viewed and evaluated these images (plain radiographs) as part of my medical decision making, as well as reviewing the written report by the radiologist.  ED MD interpretation: chest x-ray without evidence of pneumonia  Official radiology report(s): Dg Chest Portable 1 View  Result Date: 07/17/2018 CLINICAL DATA:  Fever for 3 days. EXAM: PORTABLE CHEST 1 VIEW COMPARISON:  None. FINDINGS: The cardiomediastinal silhouette is unremarkable. There is no evidence of focal airspace disease, pulmonary edema, suspicious pulmonary nodule/mass, pleural effusion, or pneumothorax. No acute bony abnormalities are identified. IMPRESSION: No active disease. Electronically Signed   By: Harmon PierJeffrey  Hu M.D.   On: 07/17/2018 11:52    ____________________________________________   PROCEDURES  Procedure(s) performed (including Critical Care):  Procedures   ____________________________________________   INITIAL IMPRESSION / ASSESSMENT AND PLAN  / ED COURSE  Perry DunningHenry R Taylor was evaluated in Emergency Department on 07/17/2018 for the symptoms described in the history of present illness. He was evaluated in the context of the global COVID-19 pandemic, which necessitated consideration that the patient might be at risk for infection with the SARS-CoV-2 virus that causes COVID-19. Institutional protocols and algorithms that pertain to the evaluation of patients at risk for COVID-19 are in a state of rapid change based on information released by regulatory bodies including the CDC and federal and state organizations. These policies and algorithms were followed during the patient's care in the ED.    Patient is well-appearing however given fever of 101 will get labs and chest x-ray to evaluate for pneumonia, urine to evaluate for UTI, coronavirus testing.  Lower suspicion for meningitis given supple neck.  Patient also does not have a headache currently.  Given patient's is so well-appearing discussed with patient lumbar puncture at this time will hold off given her very low suspicion for meningitis.  However discussed return precautions to look out for.  No rashes or cellulitis. No inflamed joints.  No abd pain to suggest infection.       12:57 PM patient continues to have no pain.  Has no UTI noted.  No evidence of pneumonia.  Chest x-ray is negative.  Patient remains asymptomatic.  Patient would like to go home.  I will call patient if the coronavirus testing is positive   Patient remained in the ER for 2 hours without evidence of fevers.  He has no risk factors for bacteremia.  He remained without symptoms.  Therefore patient was discharged home   ____________________________________________   FINAL CLINICAL IMPRESSION(S) / ED DIAGNOSES   Final diagnoses:  Viral illness    [phone]  MEDICATIONS GIVEN DURING THIS VISIT:  Medications - No data to display   ED Discharge Orders    None       Note:  This document was  prepared using Dragon voice recognition software and may include unintentional dictation errors.   Concha SeFunke, Carmichael Burdette E, MD 07/17/18 [phone]

## 2018-07-17 NOTE — Discharge Instructions (Addendum)
Your work-up was reassuring.  You had a negative chest x-ray.  Urine had no evidence of infection.  He can follow-up with your primary care doctor to talk about the difficulty urinating.  I will call you if your coronavirus testing is positive.  Return to the ER for neck stiffness or any other concerns.

## 2018-07-17 NOTE — ED Triage Notes (Addendum)
C/O fever and headache intermittently x 3-4 days.  Patient denies symptoms now, but tylenol last given at 1030.  Patient is AAOx3.  Skin warm and dry. Nad.  Symptoms worse in evening and night time.  Patient's wife reports about a one week history of generalized fatigue.  Also decrease in urine output.

## 2018-11-12 ENCOUNTER — Other Ambulatory Visit: Payer: Self-pay

## 2018-11-12 DIAGNOSIS — Z20822 Contact with and (suspected) exposure to covid-19: Secondary | ICD-10-CM

## 2018-11-14 LAB — NOVEL CORONAVIRUS, NAA: SARS-CoV-2, NAA: NOT DETECTED

## 2018-12-30 ENCOUNTER — Ambulatory Visit
Admission: EM | Admit: 2018-12-30 | Discharge: 2018-12-30 | Disposition: A | Discharge status: Home / Self Care | Payer: Medicare PPO | Attending: Family Medicine | Admitting: Family Medicine

## 2018-12-30 ENCOUNTER — Other Ambulatory Visit: Payer: Self-pay

## 2018-12-30 ENCOUNTER — Encounter: Payer: Self-pay | Admitting: Emergency Medicine

## 2018-12-30 DIAGNOSIS — K5792 Diverticulitis of intestine, part unspecified, without perforation or abscess without bleeding: Secondary | ICD-10-CM

## 2018-12-30 LAB — URINALYSIS, COMPLETE (UACMP) WITH MICROSCOPIC
Bacteria, UA: NONE SEEN
Bilirubin Urine: NEGATIVE
Glucose, UA: NEGATIVE mg/dL
Ketones, ur: NEGATIVE mg/dL
Leukocytes,Ua: NEGATIVE
Nitrite: NEGATIVE
Protein, ur: NEGATIVE mg/dL
Specific Gravity, Urine: <1.005 — ABNORMAL LOW (ref 1.005–1.030)
pH: 5.5 (ref 5.0–8.0)

## 2018-12-30 MED ORDER — METRONIDAZOLE 500 MG PO TABS: 500.0000 mg | ORAL_TABLET | Freq: Three times a day (TID) | ORAL | 0 refills | Status: AC

## 2018-12-30 MED ORDER — CIPROFLOXACIN HCL 500 MG PO TABS: 500.0000 mg | ORAL_TABLET | Freq: Two times a day (BID) | ORAL | 0 refills | Status: DC

## 2018-12-30 NOTE — ED Provider Notes (Signed)
[facility]    CSN: [id number] Arrival date & time: 12/30/18  1710      History   Chief Complaint Chief Complaint  Patient presents with  . Abdominal Pain    lower   HPI  73 year old Taylor presents with the above complaint.  Patient reports ongoing lower abdominal pain, particular in the left lower quadrant.  Started approximately 2 days ago.  He denies fever, chills, low back pain, nausea, vomiting or diarrhea.  Patient reports a history of diverticulitis.  Patient reports that he has had some urinary frequency and urgency.  He has known BPH.  He stopped taking his finasteride approximately 1 month ago due to possible side effects which he read about.  No medications or interventions tried.  No relieving factors.  No other associated symptoms.  No other complaints.  PMH, Surgical Hx, Family Hx, Social History reviewed and updated as below.  Past Medical History:  Diagnosis Date  . Arthritis    fingers  . GERD (gastroesophageal reflux disease)   . Hyperlipidemia   . Hypertension   . Motion sickness    boats, car-back seat    Patient Active Problem List   Diagnosis Date Noted  . Bruising 06/11/2015    Past Surgical History:  Procedure Laterality Date  . CATARACT EXTRACTION W/PHACO Right 01/17/2016   Procedure: CATARACT EXTRACTION PHACO AND INTRAOCULAR LENS PLACEMENT (IOC);  Surgeon: Sherald Hess, MD;  [facility]: [facility];  Service: Ophthalmology;  Laterality: Right;  RIGHT MULTI FOCAL TORIC  . CATARACT EXTRACTION W/PHACO Left 02/14/2016   Procedure: CATARACT EXTRACTION PHACO AND INTRAOCULAR LENS PLACEMENT (IOC)  Left eye  Multi focal Toric;  Surgeon: Sherald Hess, MD;  Location: [facility];  Service: Ophthalmology;  Laterality: Left;  . COLONOSCOPY    . COLONOSCOPY WITH PROPOFOL N/A 02/19/2018   Procedure: COLONOSCOPY WITH PROPOFOL;  Surgeon: Christena Deem, MD;  Location: [facility];  Service: Endoscopy;   Laterality: N/A;  . ESOPHAGOGASTRODUODENOSCOPY    . ESOPHAGOGASTRODUODENOSCOPY N/A 10/07/2014   Procedure: ESOPHAGOGASTRODUODENOSCOPY (EGD);  Surgeon: Wallace Cullens, MD;  [facility];  Service: Gastroenterology;  Laterality: N/A;  . ESOPHAGOGASTRODUODENOSCOPY (EGD) WITH PROPOFOL N/A 02/19/2018   Procedure: ESOPHAGOGASTRODUODENOSCOPY (EGD) WITH PROPOFOL;  Surgeon: Christena Deem, MD;  Location: [facility];  Service: Endoscopy;  Laterality: N/A;  . HEMORRHOID SURGERY         Home Medications    Prior to Admission medications   Medication Sig Start Date End Date Taking? Authorizing Provider  atorvastatin (LIPITOR) 20 MG tablet Take by mouth.   Yes [provider]  chlorthalidone (HYGROTON) 25 MG tablet  03/23/15  Yes [provider]  fluticasone (FLONASE) 50 MCG/ACT nasal spray Place 2 sprays into both nostrils daily.   Yes [provider]  latanoprost (XALATAN) 0.005 % ophthalmic solution Place 1 drop into both eyes at bedtime.   Yes [provider]  lisinopril (PRINIVIL,ZESTRIL) 20 MG tablet  03/23/15  Yes [provider]  Multiple Vitamins-Minerals (MULTIVITAMIN PO) Take by mouth.   Yes [provider]  Omega-3 Fatty Acids (FISH OIL PO) Take by mouth.   Yes [provider]  pantoprazole (PROTONIX) 40 MG tablet Take 40 mg by mouth daily. AM   Yes [provider]  psyllium (METAMUCIL) 58.6 % packet Take 1 packet by mouth daily.   Yes [provider]  UNABLE TO FIND Prost X. 1 tab 3 times daily   Yes [provider]  UNABLE TO FIND Symplex M. 1 tab 3 times daily   Yes [provider]  ciprofloxacin (CIPRO) 500 MG tablet Take 1 tablet (500 mg total) by mouth 2 (two) times daily. 12/30/18   Tommie Samsook, Minami Arriaga G, DO  metroNIDAZOLE (FLAGYL) 500 MG tablet Take 1 tablet (500 mg total) by mouth 3 (three) times daily for 10 days. 12/30/18 01/09/19  Tommie Samsook, Mintie Witherington G, DO    Family  History Aneurysm Father    Lung cancer Father    Myocardial Infarction (Heart attack) Father    Myocardial Infarction (Heart attack) Maternal Grandmother    Heart failure Mother      Social History Social History   Tobacco Use  . Smoking status: Former Smoker    Packs/day: 1.00    Years: 15.00    Pack years: 15.00    Types: Cigarettes  . Smokeless tobacco: Former NeurosurgeonUser    Quit date: 06/11/1983  . Tobacco comment: Quit 32 yrs ago  Substance Use Topics  . Alcohol use: No    Alcohol/week: 0.0 standard drinks  . Drug use: Never     Allergies   Flomax [tamsulosin]   Review of Systems Review of Systems  Gastrointestinal: Positive for abdominal pain.  Genitourinary: Positive for frequency and urgency.   Physical Exam Triage Vital Signs ED Triage Vitals  Enc Vitals Group     BP 12/30/18 1737 (!) 161/87     Pulse Rate 12/30/18 1737 76     Resp 12/30/18 1737 Perry     Temp 12/30/18 1737 98.2 F (36.8 C)     Temp Source 12/30/18 1737 Oral     SpO2 12/30/18 1737 100 %     Weight 12/30/18 1732 185 lb (83.9 kg)     Height 12/30/18 1732 5\' 10"  (1.778 m)     Head Circumference --      Peak Flow --      Pain Score 12/30/18 1731 5     Pain Loc --      Pain Edu? --      Excl. in [place]? --     Updated Vital Signs BP (!) 161/87 (BP Location: Left Arm)   Pulse 76   Temp 98.2 F (36.8 C) (Oral)   Resp Perry   Ht 5\' 10"  (1.778 m)   Wt 83.9 kg   SpO2 100%   BMI 26.54 kg/m   Visual Acuity Right Eye Distance:   Left Eye Distance:   Bilateral Distance:    Right Eye Near:   Left Eye Near:    Bilateral Near:     Physical Exam Vitals and nursing note reviewed.  Constitutional:      General: He is not in acute distress.    Appearance: Normal appearance. He is not ill-appearing.  HENT:     Head: Normocephalic and atraumatic.  Eyes:     General:        Right eye: No discharge.        Left eye: No discharge.     Conjunctiva/sclera: Conjunctivae normal.   Cardiovascular:     Rate and Rhythm: Normal rate and regular rhythm.     Heart sounds: No murmur.  Pulmonary:     Effort: Pulmonary effort is normal.     Breath sounds: Normal breath sounds. No wheezing, rhonchi or rales.  Abdominal:     General: There is no distension.     Palpations: Abdomen is soft.     Comments: Tenderness to palpation in the left lower  quadrant.  No rebound or guarding.  Neurological:     Mental Status: He is alert.  Psychiatric:        Mood and Affect: Mood normal.        Behavior: Behavior normal.    UC Treatments / Results  Labs (all labs ordered are listed, but only abnormal results are displayed) Labs Reviewed  URINALYSIS, COMPLETE (UACMP) WITH MICROSCOPIC - Abnormal; Notable for the following components:      Result Value   Specific Gravity, Urine <1.005 (*)    Hgb urine dipstick TRACE (*)    All other components within normal limits    EKG   Radiology No results found.  Procedures Procedures (including critical care time)  Medications Ordered in UC Medications - No data to display  Initial Impression / Assessment and Plan / UC Course  I have reviewed the triage vital signs and the nursing notes.  Pertinent labs & imaging results that were available during my care of the patient were reviewed by me and considered in my medical decision making (see chart for details).    73 year old Taylor presents with suspected early diverticulitis.  Treating with Cipro and Flagyl.  Supportive care.  Final Clinical Impressions(s) / UC Diagnoses   Final diagnoses:  Diverticulitis   Discharge Instructions   None    ED Prescriptions    Medication Sig Dispense Auth. Provider   ciprofloxacin (CIPRO) 500 MG tablet Take 1 tablet (500 mg total) by mouth 2 (two) times daily. 20 tablet Adler Alton G, DO   metroNIDAZOLE (FLAGYL) 500 MG tablet Take 1 tablet (500 mg total) by mouth 3 (three) times daily for 10 days. 30 tablet Coral Spikes, DO     PDMP  not reviewed this encounter.   Coral Spikes, [place] 12/30/18 1947

## 2018-12-30 NOTE — ED Triage Notes (Signed)
Pt c/o lower mid abdominal pain, urinary frequency. Started about 2 days ago. Denies fever, chills, lower back pain, nausea or vomiting. Pt has h/o diverticulitis about a year ago. He also stopped taking Finasteride about a month ago.

## 2019-08-13 ENCOUNTER — Other Ambulatory Visit: Payer: Self-pay

## 2019-08-13 ENCOUNTER — Ambulatory Visit
Admission: EM | Admit: 2019-08-13 | Discharge: 2019-08-13 | Disposition: A | Discharge status: Home / Self Care | Payer: Medicare PPO | Attending: Internal Medicine | Admitting: Internal Medicine

## 2019-08-13 DIAGNOSIS — S81802A Unspecified open wound, left lower leg, initial encounter: Secondary | ICD-10-CM | POA: Diagnosis not present

## 2019-08-13 MED ORDER — BACITRACIN ZINC 500 UNIT/GM EX OINT: 1.0000 | TOPICAL_OINTMENT | Freq: Two times a day (BID) | CUTANEOUS | 0 refills | Status: DC

## 2019-08-13 NOTE — ED Triage Notes (Signed)
Patient states that he cut his left leg on a piece on tin 10 days ago. Patient states that he has noticed swelling around the area x 3 days, area has also started to get warm on the left side. Patient states that area doesn't seem to be improving.

## 2019-08-13 NOTE — Discharge Instructions (Signed)
Daily wound dressing changes Avoid using alcohol and hydrogen peroxide If you notice any significant redness spreading away from the edges of the wound, please return to the urgent care to be reevaluated.

## 2019-08-15 NOTE — ED Provider Notes (Signed)
[facility]    CSN: [id number] Arrival date & time: 08/13/19  1835      History   Chief Complaint Chief Complaint  Patient presents with  . Abrasion    HPI Perry Taylor is a 74 y.o. male comes to the urgent care for evaluation of an ulcer on the leg. Patient had a cut on the left leg about 2 weeks ago. He has been treating the cut with rubbing alcohol, hydrogen peroxide and topical antiseptic. Occasional discharge from the wound. No significant or worsening surrounding erythema. HPI  Past Medical History:  Diagnosis Date  . Arthritis    fingers  . GERD (gastroesophageal reflux disease)   . Hyperlipidemia   . Hypertension   . Motion sickness    boats, car-back seat    Patient Active Problem List   Diagnosis Date Noted  . Bruising 06/11/2015    Past Surgical History:  Procedure Laterality Date  . CATARACT EXTRACTION W/PHACO Right 01/17/2016   Procedure: CATARACT EXTRACTION PHACO AND INTRAOCULAR LENS PLACEMENT (IOC);  Surgeon: Sherald Hess, MD;  Location: [facility];  Service: Ophthalmology;  Laterality: Right;  RIGHT MULTI FOCAL TORIC  . CATARACT EXTRACTION W/PHACO Left 02/14/2016   Procedure: CATARACT EXTRACTION PHACO AND INTRAOCULAR LENS PLACEMENT (IOC)  Left eye  Multi focal Toric;  Surgeon: Sherald Hess, MD;  Location: [facility];  Service: Ophthalmology;  Laterality: Left;  . COLONOSCOPY    . COLONOSCOPY WITH PROPOFOL N/A 02/19/2018   Procedure: COLONOSCOPY WITH PROPOFOL;  Surgeon: Christena Deem, MD;  Location: [facility];  Service: Endoscopy;  Laterality: N/A;  . ESOPHAGOGASTRODUODENOSCOPY    . ESOPHAGOGASTRODUODENOSCOPY N/A 10/07/2014   Procedure: ESOPHAGOGASTRODUODENOSCOPY (EGD);  Surgeon: Wallace Cullens, MD;  Location: [facility];  Service: Gastroenterology;  Laterality: N/A;  . ESOPHAGOGASTRODUODENOSCOPY (EGD) WITH PROPOFOL N/A 02/19/2018   Procedure: ESOPHAGOGASTRODUODENOSCOPY (EGD) WITH  PROPOFOL;  Surgeon: Christena Deem, MD;  Location: [facility] END[facility];  Service: Endoscopy;  Laterality: N/A;  . HEMORRHOID SURGERY         Home Medications    Prior to Admission medications   Medication Sig Start Date End Date Taking? Authorizing Provider  atorvastatin (LIPITOR) 20 MG tablet Take by mouth.   Yes [provider]  chlorthalidone (HYGROTON) 25 MG tablet  03/23/15  Yes [provider]  fluticasone (FLONASE) 50 MCG/ACT nasal spray Place 2 sprays into both nostrils daily.   Yes [provider]  lisinopril (PRINIVIL,ZESTRIL) 20 MG tablet  03/23/15  Yes [provider]  Multiple Vitamins-Minerals (MULTIVITAMIN PO) Take by mouth.   Yes [provider]  pantoprazole (PROTONIX) 40 MG tablet Take 40 mg by mouth daily. AM   Yes [provider]  tadalafil (CIALIS) 5 MG tablet Take 5 mg by mouth daily. 08/03/19  Yes [provider]  timolol (TIMOPTIC) 0.5 % ophthalmic solution 1 drop 2 (two) times daily. 05/24/19  Yes [provider]  UNABLE TO FIND Prost X. 1 tab 3 times daily   Yes [provider]  UNABLE TO FIND Symplex M. 1 tab 3 times daily   Yes [provider]  bacitracin ointment Apply 1 application topically 2 (two) times daily. 08/13/19   Latania Bascomb, Britta Mccreedy, MD  latanoprost (XALATAN) 0.005 % ophthalmic solution Place 1 drop into both eyes at bedtime.    [provider]    Family History History reviewed. No pertinent family history.  Social History Social History   Tobacco Use  .  Smoking status: Former Smoker    Packs/day: 1.00    Years: 15.00    Pack years: 15.00    Types: Cigarettes  . Smokeless tobacco: Former Neurosurgeon    Quit date: 06/11/1983  . Tobacco comment: Quit 32 yrs ago  Vaping Use  . Vaping Use: Never used  Substance Use Topics  . Alcohol use: No    Alcohol/week: 0.0 standard drinks  . Drug use: Never     Allergies   Flomax [tamsulosin]   Review of  Systems Review of Systems  Respiratory: Negative.   Genitourinary: Negative.   Musculoskeletal: Negative.   Skin: Positive for color change and wound. Negative for pallor and rash.     Physical Exam Triage Vital Signs ED Triage Vitals  Enc Vitals Group     BP 08/13/19 1847 121/82     Pulse Rate 08/13/19 1847 66     Resp 08/13/19 1847 18     Temp 08/13/19 1847 98 F (36.7 C)     Temp Source 08/13/19 1847 Oral     SpO2 08/13/19 1847 99 %     Weight 08/13/19 1849 184 lb (83.5 kg)     Height 08/13/19 1849 5\' 10"  (1.778 m)     Head Circumference --      Peak Flow --      Pain Score 08/13/19 1849 2     Pain Loc --      Pain Edu? --      Excl. in GC? --    No data found.  Updated Vital Signs BP 121/82 (BP Location: Left Arm)   Pulse 66   Temp 98 F (36.7 C) (Oral)   Resp 18   Ht 5\' 10"  (1.778 m)   Wt 83.5 kg   SpO2 99%   BMI 26.40 kg/m   Visual Acuity Right Eye Distance:   Left Eye Distance:   Bilateral Distance:    Right Eye Near:   Left Eye Near:    Bilateral Near:     Physical Exam Vitals and nursing note reviewed.  Constitutional:      General: He is not in acute distress.    Appearance: He is not ill-appearing.  Musculoskeletal:        General: No swelling, deformity or signs of injury. Normal range of motion.  Skin:    Comments: Wound on the distal aspect of left leg. Wound measures about 1 inch in the longest diameter. No significant surrounding erythema. Mercurochrome has been applied to the wound  Neurological:     Mental Status: He is alert.      UC Treatments / Results  Labs (all labs ordered are listed, but only abnormal results are displayed) Labs Reviewed - No data to display  EKG   Radiology No results found.  Procedures Procedures (including critical care time)  Medications Ordered in UC Medications - No data to display  Initial Impression / Assessment and Plan / UC Course  I have reviewed the triage vital signs and the  nursing notes.  Pertinent labs & imaging results that were available during my care of the patient were reviewed by me and considered in my medical decision making (see chart for details).     1. Wound on the distal aspect of the left leg: Patient is counseled to avoid using rubbing alcohol, hydrogen peroxide or Mercurochrome solution. Daily wound dressing changes advised The wound does not look infected at this time. I suspect that the delayed wound is as  a result of the dressing agents that the patient uses. Final Clinical Impressions(s) / UC Diagnoses   Final diagnoses:  Wound of left leg, initial encounter     Discharge Instructions     Daily wound dressing changes Avoid using alcohol and hydrogen peroxide If you notice any significant redness spreading away from the edges of the wound, please return to the urgent care to be reevaluated.   ED Prescriptions    Medication Sig Dispense Auth. Provider   bacitracin ointment Apply 1 application topically 2 (two) times daily. 120 g Calen Geister, Britta Mccreedy, MD     PDMP not reviewed this encounter.   Merrilee Jansky, MD 08/15/19 [phone]

## 2020-05-11 ENCOUNTER — Encounter: Payer: Self-pay | Admitting: Otolaryngology

## 2020-05-17 NOTE — Discharge Instructions (Signed)
[facility] ENDOSCOPIC SINUS SURGERY [facility]  What is Functional Endoscopic Sinus Surgery?  The Surgery involves making the natural openings of the sinuses larger by removing the bony partitions that separate the sinuses from the nasal cavity.  The natural sinus lining is preserved as much as possible to allow the sinuses to resume normal function after the surgery.  In some patients nasal polyps (excessively swollen lining of the sinuses) may be removed to relieve obstruction of the sinus openings.  The surgery is performed through the nose using lighted scopes, which eliminates the need for incisions on the face.  A septoplasty is a different procedure which is sometimes performed with sinus surgery.  It involves straightening the boy partition that separates the two sides of your nose.  A crooked or deviated septum may need repair if is obstructing the sinuses or nasal airflow.  Turbinate reduction is also often performed during sinus surgery.  The turbinates are bony proturberances from the side walls of the nose which swell and can obstruct the nose in patients with sinus and allergy problems.  Their size can be surgically reduced to help relieve nasal obstruction.  What Can Sinus Surgery Do For Me?  Sinus surgery can reduce the frequency of sinus infections requiring antibiotic treatment.  This can provide improvement in nasal congestion, post-nasal drainage, facial pressure and nasal obstruction.  Surgery will NOT prevent you from ever having an infection again, so it usually only for patients who get infections 4 or more times yearly requiring antibiotics, or for infections that do not clear with antibiotics.  It will not cure nasal allergies, so patients with allergies may still require medication to treat their allergies after surgery. Surgery may improve headaches related to sinusitis, however, some people will continue to  require medication to control sinus headaches related to allergies.  Surgery will do nothing for other forms of headache (migraine, tension or cluster).  What Are the Risks of Endoscopic Sinus Surgery?  Current techniques allow surgery to be performed safely with little risk, however, there are rare complications that patients should be aware of.  Because the sinuses are located around the eyes, there is risk of eye injury, including blindness, though again, this would be quite rare. This is usually a result of bleeding behind the eye during surgery, which puts the vision oat risk, though there are treatments to protect the vision and prevent permanent disrupted by surgery causing a leak of the spinal fluid that surrounds the brain.  More serious complications would include bleeding inside the brain cavity or damage to the brain.  Again, all of these complications are uncommon, and spinal fluid leaks can be safely managed surgically if they occur.  The most common complication of sinus surgery is bleeding from the nose, which may require packing or cauterization of the nose.  Continued sinus have polyps may experience recurrence of the polyps requiring revision surgery.  Alterations of sense of smell or injury to the tear ducts are also rare complications.   What is the Surgery Like, and what is the Recovery?  The Surgery usually takes a couple of hours to perform, and is usually performed under a general anesthetic (completely asleep).  Patients are usually discharged home after a couple of hours.  Sometimes during surgery it is necessary to pack the nose to control bleeding, and the packing is left in place for 24 - 48 hours, and removed by your surgeon.    If a septoplasty was performed during the procedure, there is often a splint placed which must be removed after 5-7 days.   Discomfort: Pain is usually mild to moderate, and can be controlled by prescription pain medication or acetaminophen (Tylenol).   Aspirin, Ibuprofen (Advil, Motrin), or Naprosyn (Aleve) should be avoided, as they can cause increased bleeding.  Most patients feel sinus pressure like they have a bad head cold for several days.  Sleeping with your head elevated can help reduce swelling and facial pressure, as can ice packs over the face.  A humidifier may be helpful to keep the mucous and blood from drying in the nose.   Diet: There are no specific diet restrictions, however, you should generally start with clear liquids and a light diet of bland foods because the anesthetic can cause some nausea.  Advance your diet depending on how your stomach feels.  Taking your pain medication with food will often help reduce stomach upset which pain medications can cause.  Nasal Saline Irrigation: It is important to remove blood clots and dried mucous from the nose as it is healing.  This is done by having you irrigate the nose at least 3 - 4 times daily with a salt water solution.  We recommend using NeilMed Sinus Rinse (available at the drug store).  Fill the squeeze bottle with the solution, bend over a sink, and insert the tip of the squeeze bottle into the nose  of an inch.  Point the tip of the squeeze bottle towards the inside corner of the eye on the same side your irrigating.  Squeeze the bottle and gently irrigate the nose.  If you bend forward as you do this, most of the fluid will flow back out of the nose, instead of down your throat.   The solution should be warm, near body temperature, when you irrigate.   Each time you irrigate, you should use a full squeeze bottle.   Note that if you are instructed to use Nasal Steroid Sprays at any time after your surgery, irrigate with saline BEFORE using the steroid spray, so you do not wash it all out of the nose. Another product, Nasal Saline Gel (such as AYR Nasal Saline Gel) can be applied in each nostril 3 - 4 times daily to moisture the nose and reduce scabbing or crusting.  Bleeding:   Bloody drainage from the nose can be expected for several days, and patients are instructed to irrigate their nose frequently with salt water to help remove mucous and blood clots.  The drainage may be dark red or brown, though some fresh blood may be seen intermittently, especially after irrigation.  Do not blow you nose, as bleeding may occur. If you must sneeze, keep your mouth open to allow air to escape through your mouth.  If heavy bleeding occurs: Irrigate the nose with saline to rinse out clots, then spray the nose 3 - 4 times with Afrin Nasal Decongestant Spray.  The spray will constrict the blood vessels to slow bleeding.  Pinch the lower half of your nose shut to apply pressure, and lay down with your head elevated.  Ice packs over the nose may help as well. If bleeding persists despite these measures, you should notify your doctor.  Do not use the Afrin routinely to control nasal congestion after surgery, as it can result in worsening congestion and may affect healing.     Activity: Return to work varies among patients. Most patients will be   out of work at least 5 - 7 days to recover.  Patient may return to work after they are off of narcotic pain medication, and feeling well enough to perform the functions of their job.  Patients must avoid heavy lifting (over 10 pounds) or strenuous physical for 2 weeks after surgery, so your employer may need to assign you to light duty, or keep you out of work longer if light duty is not possible.  NOTE: you should not drive, operate dangerous machinery, do any mentally demanding tasks or make any important legal or financial decisions while on narcotic pain medication and recovering from the general anesthetic.    Call Your Doctor Immediately if You Have Any of the Following: 1. Bleeding that you cannot control with the above measures 2. Loss of vision, double vision, bulging of the eye or black eyes. 3. Fever over 101 degrees 4. Neck stiffness with  severe headache, fever, nausea and change in mental state. You are always encourage to call anytime with concerns, however, please call with requests for pain medication refills during office hours.  Office Endoscopy: During follow-up visits your doctor will remove any packing or splints that may have been placed and evaluate and clean your sinuses endoscopically.  Topical anesthetic will be used to make this as comfortable as possible, though you may want to take your pain medication prior to the visit.  How often this will need to be done varies from patient to patient.  After complete recovery from the surgery, you may need follow-up endoscopy from time to time, particularly if there is concern of recurrent infection or nasal polyps.  General Anesthesia, Adult, Care After This sheet gives you information about how to care for yourself after your procedure. Your health care provider may also give you more specific instructions. If you have problems or questions, contact your health care provider. What can I expect after the procedure? After the procedure, the following side effects are common:  Pain or discomfort at the IV site.  Nausea.  Vomiting.  Sore throat.  Trouble concentrating.  Feeling cold or chills.  Feeling weak or tired.  Sleepiness and fatigue.  Soreness and body aches. These side effects can affect parts of the body that were not involved in surgery. Follow these instructions at home: For the time period you were told by your health care provider:  Rest.  Do not participate in activities where you could fall or become injured.  Do not drive or use machinery.  Do not drink alcohol.  Do not take sleeping pills or medicines that cause drowsiness.  Do not make important decisions or sign legal documents.  Do not take care of children on your own.   Eating and drinking  Follow any instructions from your health care provider about eating or drinking  restrictions.  When you feel hungry, start by eating small amounts of foods that are soft and easy to digest (bland), such as toast. Gradually return to your regular diet.  Drink enough fluid to keep your urine pale yellow.  If you vomit, rehydrate by drinking water, juice, or clear broth. General instructions  If you have sleep apnea, surgery and certain medicines can increase your risk for breathing problems. Follow instructions from your health care provider about wearing your sleep device: ? Anytime you are sleeping, including during daytime naps. ? While taking prescription pain medicines, sleeping medicines, or medicines that make you drowsy.  Have a responsible adult stay with you for the   time you are told. It is important to have someone help care for you until you are awake and alert.  Return to your normal activities as told by your health care provider. Ask your health care provider what activities are safe for you.  Take over-the-counter and prescription medicines only as told by your health care provider.  If you smoke, do not smoke without supervision.  Keep all follow-up visits as told by your health care provider. This is important. Contact a health care provider if:  You have nausea or vomiting that does not get better with medicine.  You cannot eat or drink without vomiting.  You have pain that does not get better with medicine.  You are unable to pass urine.  You develop a skin rash.  You have a fever.  You have redness around your IV site that gets worse. Get help right away if:  You have difficulty breathing.  You have chest pain.  You have blood in your urine or stool, or you vomit blood. Summary  After the procedure, it is common to have a sore throat or nausea. It is also common to feel tired.  Have a responsible adult stay with you for the time you are told. It is important to have someone help care for you until you are awake and  alert.  When you feel hungry, start by eating small amounts of foods that are soft and easy to digest (bland), such as toast. Gradually return to your regular diet.  Drink enough fluid to keep your urine pale yellow.  Return to your normal activities as told by your health care provider. Ask your health care provider what activities are safe for you. This information is not intended to replace advice given to you by your health care provider. Make sure you discuss any questions you have with your health care provider. Document Revised: 09/11/2019 Document Reviewed: 04/10/2019 Elsevier Patient Education  2021 [employer]  

## 2020-05-18 ENCOUNTER — Other Ambulatory Visit
Admission: RE | Admit: 2020-05-18 | Discharge: 2020-05-18 | Disposition: A | Discharge status: Home / Self Care | Payer: Medicare Other | Source: Ambulatory Visit | Attending: Otolaryngology | Admitting: Otolaryngology

## 2020-05-18 ENCOUNTER — Other Ambulatory Visit: Payer: Self-pay

## 2020-05-18 DIAGNOSIS — Z01812 Encounter for preprocedural laboratory examination: Secondary | ICD-10-CM | POA: Insufficient documentation

## 2020-05-18 DIAGNOSIS — Z20822 Contact with and (suspected) exposure to covid-19: Secondary | ICD-10-CM | POA: Diagnosis not present

## 2020-05-18 LAB — SARS CORONAVIRUS 2 (TAT 6-24 HRS): SARS Coronavirus 2: NEGATIVE

## 2020-05-20 ENCOUNTER — Other Ambulatory Visit: Payer: Self-pay

## 2020-05-20 ENCOUNTER — Ambulatory Visit
Admission: RE | Admit: 2020-05-20 | Discharge: 2020-05-20 | Disposition: A | Discharge status: Home / Self Care | Payer: Medicare Other | Attending: Otolaryngology | Admitting: Otolaryngology

## 2020-05-20 ENCOUNTER — Encounter: Payer: Self-pay | Admitting: Otolaryngology

## 2020-05-20 ENCOUNTER — Ambulatory Visit: Payer: Medicare Other | Admitting: Anesthesiology

## 2020-05-20 ENCOUNTER — Encounter
Admission: RE | Disposition: A | Discharge status: Home / Self Care | Payer: Self-pay | Source: Home / Self Care | Attending: Otolaryngology

## 2020-05-20 DIAGNOSIS — J342 Deviated nasal septum: Secondary | ICD-10-CM | POA: Diagnosis present

## 2020-05-20 DIAGNOSIS — R040 Epistaxis: Secondary | ICD-10-CM | POA: Insufficient documentation

## 2020-05-20 DIAGNOSIS — Z79899 Other long term (current) drug therapy: Secondary | ICD-10-CM | POA: Insufficient documentation

## 2020-05-20 DIAGNOSIS — J343 Hypertrophy of nasal turbinates: Secondary | ICD-10-CM | POA: Insufficient documentation

## 2020-05-20 DIAGNOSIS — Z87891 Personal history of nicotine dependence: Secondary | ICD-10-CM | POA: Insufficient documentation

## 2020-05-20 HISTORY — DX: Dyspnea, unspecified: R06.00

## 2020-05-20 HISTORY — DX: Cardiac murmur, unspecified: R01.1

## 2020-05-20 HISTORY — DX: Endocarditis, valve unspecified: I38

## 2020-05-20 HISTORY — PX: SEPTOPLASTY: SHX2393

## 2020-05-20 HISTORY — PX: TURBINATE REDUCTION: SHX6157

## 2020-05-20 SURGERY — SEPTOPLASTY, NOSE
Anesthesia: General | Site: Nose

## 2020-05-20 MED ORDER — DEXAMETHASONE SODIUM PHOSPHATE 4 MG/ML IJ SOLN
INTRAMUSCULAR | Status: DC | PRN
  Administered 2020-05-20: 10 mg via INTRAVENOUS

## 2020-05-20 MED ORDER — ONDANSETRON HCL 4 MG/2ML IJ SOLN: 4.0000 mg | Freq: Once | INTRAMUSCULAR | Status: DC | PRN

## 2020-05-20 MED ORDER — GLYCOPYRROLATE 0.2 MG/ML IJ SOLN
INTRAMUSCULAR | Status: DC | PRN
  Administered 2020-05-20: .1 mg via INTRAVENOUS

## 2020-05-20 MED ORDER — ONDANSETRON HCL 4 MG/2ML IJ SOLN
INTRAMUSCULAR | Status: DC | PRN
  Administered 2020-05-20: 4 mg via INTRAVENOUS

## 2020-05-20 MED ORDER — PREDNISONE 10 MG PO TABS: ORAL_TABLET | ORAL | 0 refills | Status: DC

## 2020-05-20 MED ORDER — ACETAMINOPHEN 325 MG PO TABS
325.0000 mg | ORAL_TABLET | ORAL | Status: DC | PRN
Start: 2020-05-20 — End: 2020-05-20

## 2020-05-20 MED ORDER — OXYMETAZOLINE HCL 0.05 % NA SOLN
2.0000 | Freq: Once | NASAL | Status: AC
  Administered 2020-05-20: 2 via NASAL

## 2020-05-20 MED ORDER — CEPHALEXIN 500 MG PO CAPS: 500.0000 mg | ORAL_CAPSULE | Freq: Two times a day (BID) | ORAL | 0 refills | Status: DC

## 2020-05-20 MED ORDER — SUCCINYLCHOLINE CHLORIDE 20 MG/ML IJ SOLN
INTRAMUSCULAR | Status: DC | PRN
  Administered 2020-05-20: 80 mg via INTRAVENOUS

## 2020-05-20 MED ORDER — LIDOCAINE HCL (CARDIAC) PF 100 MG/5ML IV SOSY
PREFILLED_SYRINGE | INTRAVENOUS | Status: DC | PRN
  Administered 2020-05-20: 50 mg via INTRAVENOUS

## 2020-05-20 MED ORDER — EPHEDRINE SULFATE 50 MG/ML IJ SOLN
INTRAMUSCULAR | Status: DC | PRN
  Administered 2020-05-20 (×2): 5 mg via INTRAVENOUS
  Administered 2020-05-20: 10 mg via INTRAVENOUS
  Administered 2020-05-20: 5 mg via INTRAVENOUS

## 2020-05-20 MED ORDER — FENTANYL CITRATE (PF) 100 MCG/2ML IJ SOLN: 25.0000 ug | INTRAMUSCULAR | Status: DC | PRN

## 2020-05-20 MED ORDER — FENTANYL CITRATE (PF) 100 MCG/2ML IJ SOLN
INTRAMUSCULAR | Status: DC | PRN
  Administered 2020-05-20: 25 ug via INTRAVENOUS
  Administered 2020-05-20: 50 ug via INTRAVENOUS
  Administered 2020-05-20 (×2): 25 ug via INTRAVENOUS

## 2020-05-20 MED ORDER — OXYCODONE HCL 5 MG/5ML PO SOLN
5.0000 mg | Freq: Once | ORAL | Status: AC | PRN
  Administered 2020-05-20: 5 mg via ORAL

## 2020-05-20 MED ORDER — ACETAMINOPHEN 160 MG/5ML PO SOLN: 325.0000 mg | ORAL | Status: DC | PRN

## 2020-05-20 MED ORDER — OXYCODONE HCL 5 MG PO TABS: 5.0000 mg | ORAL_TABLET | Freq: Once | ORAL | Status: AC | PRN

## 2020-05-20 MED ORDER — LACTATED RINGERS IV SOLN: INTRAVENOUS | Status: DC

## 2020-05-20 MED ORDER — DEXTROSE 5 % IV SOLN
2000.0000 mg | Freq: Once | INTRAVENOUS | Status: AC
  Administered 2020-05-20: 2000 mg via INTRAVENOUS

## 2020-05-20 MED ORDER — PHENYLEPHRINE HCL 0.5 % NA SOLN
NASAL | Status: DC | PRN
  Administered 2020-05-20: 30 mL via TOPICAL

## 2020-05-20 MED ORDER — HYDROCODONE-ACETAMINOPHEN 5-325 MG PO TABS: 1.0000 | ORAL_TABLET | Freq: Four times a day (QID) | ORAL | 0 refills | Status: AC | PRN

## 2020-05-20 MED ORDER — MIDAZOLAM HCL 5 MG/5ML IJ SOLN
INTRAMUSCULAR | Status: DC | PRN
  Administered 2020-05-20: 1 mg via INTRAVENOUS

## 2020-05-20 MED ORDER — LIDOCAINE-EPINEPHRINE 1 %-1:100000 IJ SOLN
INTRAMUSCULAR | Status: DC | PRN
  Administered 2020-05-20: 8 mL

## 2020-05-20 MED ORDER — PROPOFOL 10 MG/ML IV BOLUS
INTRAVENOUS | Status: DC | PRN
  Administered 2020-05-20: 150 mg via INTRAVENOUS
  Administered 2020-05-20: 20 mg via INTRAVENOUS
  Administered 2020-05-20: 30 mg via INTRAVENOUS

## 2020-05-20 SURGICAL SUPPLY — 26 items
CANISTER SUCT 1200ML W/VALVE (MISCELLANEOUS) ×3 IMPLANT
COAGULATOR SUCT 8FR VV (MISCELLANEOUS) ×3 IMPLANT
ELECT REM PT RETURN 9FT ADLT (ELECTROSURGICAL) ×3
ELECTRODE REM PT RTRN 9FT ADLT (ELECTROSURGICAL) ×2 IMPLANT
GLOVE PI ULTRA LF STRL 7.5 (GLOVE) ×4 IMPLANT
GLOVE PI ULTRA NON LATEX 7.5 (GLOVE) ×2
GOWN STRL REUS W/ TWL LRG LVL3 (GOWN DISPOSABLE) ×2 IMPLANT
GOWN STRL REUS W/TWL LRG LVL3 (GOWN DISPOSABLE) ×3
KIT TURNOVER KIT A (KITS) ×3 IMPLANT
NEEDLE ANESTHESIA  27G X 3.5 (NEEDLE) ×1
NEEDLE ANESTHESIA 27G X 3.5 (NEEDLE) ×2 IMPLANT
NEEDLE HYPO 27GX1-1/4 (NEEDLE) ×3 IMPLANT
PACK ENT CUSTOM (PACKS) ×3 IMPLANT
PATTIES SURGICAL .5 X3 (DISPOSABLE) ×3 IMPLANT
SOL ANTI-FOG 6CC FOG-OUT (MISCELLANEOUS) ×2 IMPLANT
SOL FOG-OUT ANTI-FOG 6CC (MISCELLANEOUS) ×1
SPLINT NASAL SEPTAL BLV .50 ST (MISCELLANEOUS) ×3 IMPLANT
STRAP BODY AND KNEE 60X3 (MISCELLANEOUS) ×3 IMPLANT
SUT CHROMIC 3-0 (SUTURE) ×3
SUT CHROMIC 3-0 KS 27XMFL CR (SUTURE) ×2
SUT ETHILON 3-0 KS 30 BLK (SUTURE) ×3 IMPLANT
SUT PLAIN GUT 4-0 (SUTURE) ×3 IMPLANT
SUTURE CHRMC 3-0 KS 27XMFL CR (SUTURE) ×2 IMPLANT
SYR 3ML LL SCALE MARK (SYRINGE) ×3 IMPLANT
TOWEL OR 17X26 4PK STRL BLUE (TOWEL DISPOSABLE) ×3 IMPLANT
WATER STERILE IRR 250ML POUR (IV SOLUTION) ×3 IMPLANT

## 2020-05-20 NOTE — Anesthesia Procedure Notes (Addendum)
Procedure Name: Intubation Date/Time: 05/20/2020 8:41 AM Performed by: Jimmy Picket, CRNA Pre-anesthesia Checklist: Patient identified, Emergency Drugs available, Suction available, Patient being monitored and Timeout performed Patient Re-evaluated:Patient Re-evaluated prior to induction Oxygen Delivery Method: Circle system utilized Preoxygenation: Pre-oxygenation with 100% oxygen Induction Type: IV induction Ventilation: Mask ventilation without difficulty Laryngoscope Size: Miller and 3 Grade View: Grade I Tube type: Oral Rae Tube size: 7.5 mm Number of attempts: 1 Placement Confirmation: ETT inserted through vocal cords under direct vision,  positive ETCO2 and breath sounds checked- equal and bilateral Tube secured with: Tape Dental Injury: Teeth and Oropharynx as per pre-operative assessment

## 2020-05-20 NOTE — Op Note (Signed)
05/20/2020  10:01 AM  [id number]   Pre-Op Dx:  Deviated Nasal Septum, Hypertrophic Inferior Turbinates  Post-op Dx: Same  Proc: Nasal Septoplasty, Bilateral Partial Reduction Inferior Turbinates   Surg:  Beverly Sessions Gasper Hopes  Anes:  GOT  EBL: 50 mL  Comp: None  Findings: Extremely deviated septum to the left side with the ethmoid plate and vomer and maxillary crest all to the left.  The right inferior turbinate was significantly overgrown  Procedure: With the patient in a comfortable supine position,  general orotracheal anesthesia was induced without difficulty.     The patient received preoperative Afrin spray for topical decongestion and vasoconstriction.  Intravenous prophylactic antibiotics were administered.  At an appropriate level, the patient was placed in a semi-sitting position.  Nasal vibrissae were trimmed.   1% Xylocaine with 1:100,000 epinephrine, 8 cc's, was infiltrated into the anterior floor of the nose, into the nasal spine region, into the membranous columella, and finally into the submucoperichondrial plane of the septum on both sides.  Several minutes were allowed for this to take effect.  Cottoniod pledgetts soaked in Afrin and 4% Xylocaine were placed into both nasal cavities and left while the patient was prepped and draped in the standard fashion.  The materials were removed from the nose and observed to be intact and correct in number.  The nose was inspected with a headlight and zero degree scope with the findings as described above.  A left hemitransfixion incision was sharply executed and carried down to the quadrangular cartilage. The mucoperichondrium was elelvated along the quadrangular plate back to the bony-cartilaginous junction on both sides. The mucoperiostium was then elevated along the ethmoid plate and the vomer. The boney-catilaginous junction was then split with a freer elevator and the mucoperiosteum was elevated on the opposite side. The  mucoperiosteum was then elevated along the maxillary crest as needed to expose the crooked bone of the crest.  Boney spurs of the vomer and maxillary crest were removed with Lenoria Chime forceps.  The cartilaginous plate was trimmed along its posterior and inferior borders of about 2 mm of cartilage to free it up inferiorly. Some of the deviated ethmoid plate was then fractured and removed with Takahashi forceps to free up the posterior border of the quadrangular plate and allow it to swing back to the midline. The mucosal flaps were placed back into their anatomic position to allow visualization of the airways. The septum now sat in the midline with an improved airway.  A 3-0 Chromic suture on a Keith needle in used to anchor the inferior septum at the nasal spine with a through and through suture. The mucosal flaps are then sutured together using a through and through whip stitch of 4-0 Plain Gut with a mini-Keith needle. This was used to close the hemitransfixion incision as well.   The inferior turbinates were then inspected. An incision was created along the inferior aspect of the left inferior turbinate with removal of some of the inferior soft tissue and bone. Electrocautery was used to control bleeding in the area. The remaining turbinate was then outfractured to open up the airway further. There was no significant bleeding noted. The right turbinate was then trimmed and outfractured in a similar fashion.  The airways were then visualized and showed open passageways on both sides that were significantly improved compared to before surgery. There was no signifcant bleeding. Nasal splints were applied to both sides of the septum using Xomed 0.55mm regular sized splints that were trimmed,  and then held in position with a 3-0 Nylon through and through suture.  The patient was turned back over to anesthesia, and awakened, extubated, and taken to the PACU in satisfactory condition.  Dispo:   PACU to  home  Plan: Ice, elevation, narcotic analgesia, steroid taper, and prophylactic antibiotics for the duration of indwelling nasal foreign bodies.  We will reevaluate the patient in the office in 6 days and remove the septal splints.  Return to work in 10 days, strenuous activities in two weeks.   Beverly Sessions Devonne Kitchen 05/20/2020 10:01 AM

## 2020-05-20 NOTE — Transfer of Care (Signed)
Immediate Anesthesia Transfer of Care Note  Patient: Perry Taylor  Procedure(s) Performed: SEPTOPLASTY (N/A Nose) TURBINATE REDUCTION (Bilateral Nose)  Patient Location: PACU  Anesthesia Type: General ETT  Level of Consciousness: awake, alert  and patient cooperative  Airway and Oxygen Therapy: Patient Spontanous Breathing and Patient connected to supplemental oxygen  Post-op Assessment: Post-op Vital signs reviewed, Patient's Cardiovascular Status Stable, Respiratory Function Stable, Patent Airway and No signs of Nausea or vomiting  Post-op Vital Signs: Reviewed and stable  Complications: No complications documented.

## 2020-05-20 NOTE — H&P (Signed)
H&P has been reviewed and patient reevaluated, no changes necessary. To be downloaded later.  

## 2020-05-20 NOTE — Anesthesia Postprocedure Evaluation (Signed)
Anesthesia Post Note  Patient: Perry Taylor  Procedure(s) Performed: SEPTOPLASTY (N/A Nose) TURBINATE REDUCTION (Bilateral Nose)     Patient location during evaluation: PACU Anesthesia Type: General Level of consciousness: awake and alert and oriented Pain management: satisfactory to patient Vital Signs Assessment: post-procedure vital signs reviewed and stable Respiratory status: spontaneous breathing, nonlabored ventilation and respiratory function stable Cardiovascular status: blood pressure returned to baseline and stable Postop Assessment: Adequate PO intake and No signs of nausea or vomiting Anesthetic complications: no   No complications documented.  Cherly Beach

## 2020-05-20 NOTE — Anesthesia Preprocedure Evaluation (Signed)
Anesthesia Evaluation  Patient identified by MRN, date of birth, ID band Patient awake    Reviewed: Allergy & Precautions, H&P , NPO status , Patient's Chart, lab work & pertinent test results  Airway Mallampati: II  TM Distance: >3 FB Neck ROM: full    Dental no notable dental hx.    Pulmonary former smoker,    Pulmonary exam normal breath sounds clear to auscultation       Cardiovascular hypertension, Normal cardiovascular exam Rhythm:regular Rate:Normal     Neuro/Psych    GI/Hepatic GERD  ,  Endo/Other    Renal/GU      Musculoskeletal   Abdominal   Peds  Hematology   Anesthesia Other Findings   Reproductive/Obstetrics                             Anesthesia Physical Anesthesia Plan  ASA: II  Anesthesia Plan: General ETT   Post-op Pain Management:    Induction:   PONV Risk Score and Plan: 2 and Treatment may vary due to age or medical condition, Ondansetron and Dexamethasone  Airway Management Planned:   Additional Equipment:   Intra-op Plan:   Post-operative Plan:   Informed Consent: I have reviewed the patients History and Physical, chart, labs and discussed the procedure including the risks, benefits and alternatives for the proposed anesthesia with the patient or authorized representative who has indicated his/her understanding and acceptance.     Dental Advisory Given  Plan Discussed with: CRNA  Anesthesia Plan Comments:         Anesthesia Quick Evaluation

## 2020-05-21 ENCOUNTER — Encounter: Payer: Self-pay | Admitting: Otolaryngology

## 2021-01-08 IMAGING — CR ABDOMEN - 2 VIEW
4 series · 4 of 4 positions shown · non-contrast
Comparison: 03/26/2011 CT abdomen/pelvis

CLINICAL DATA: Left lower quadrant pain

EXAM:
ABDOMEN - 2 VIEW

[abdomen erect (1 of 2)]
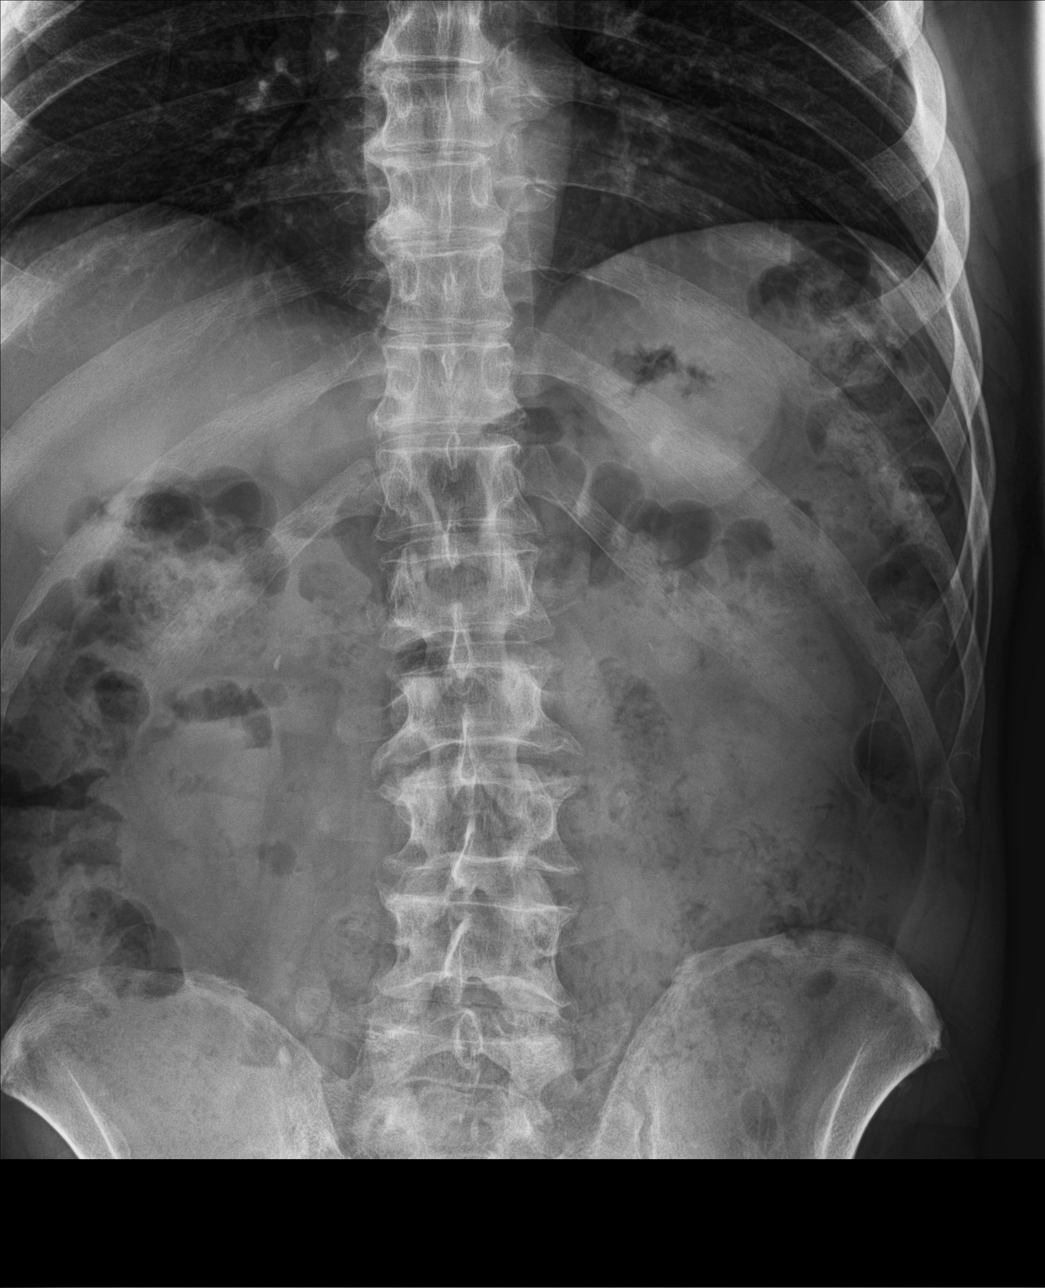

[abdomen erect (2 of 2)]
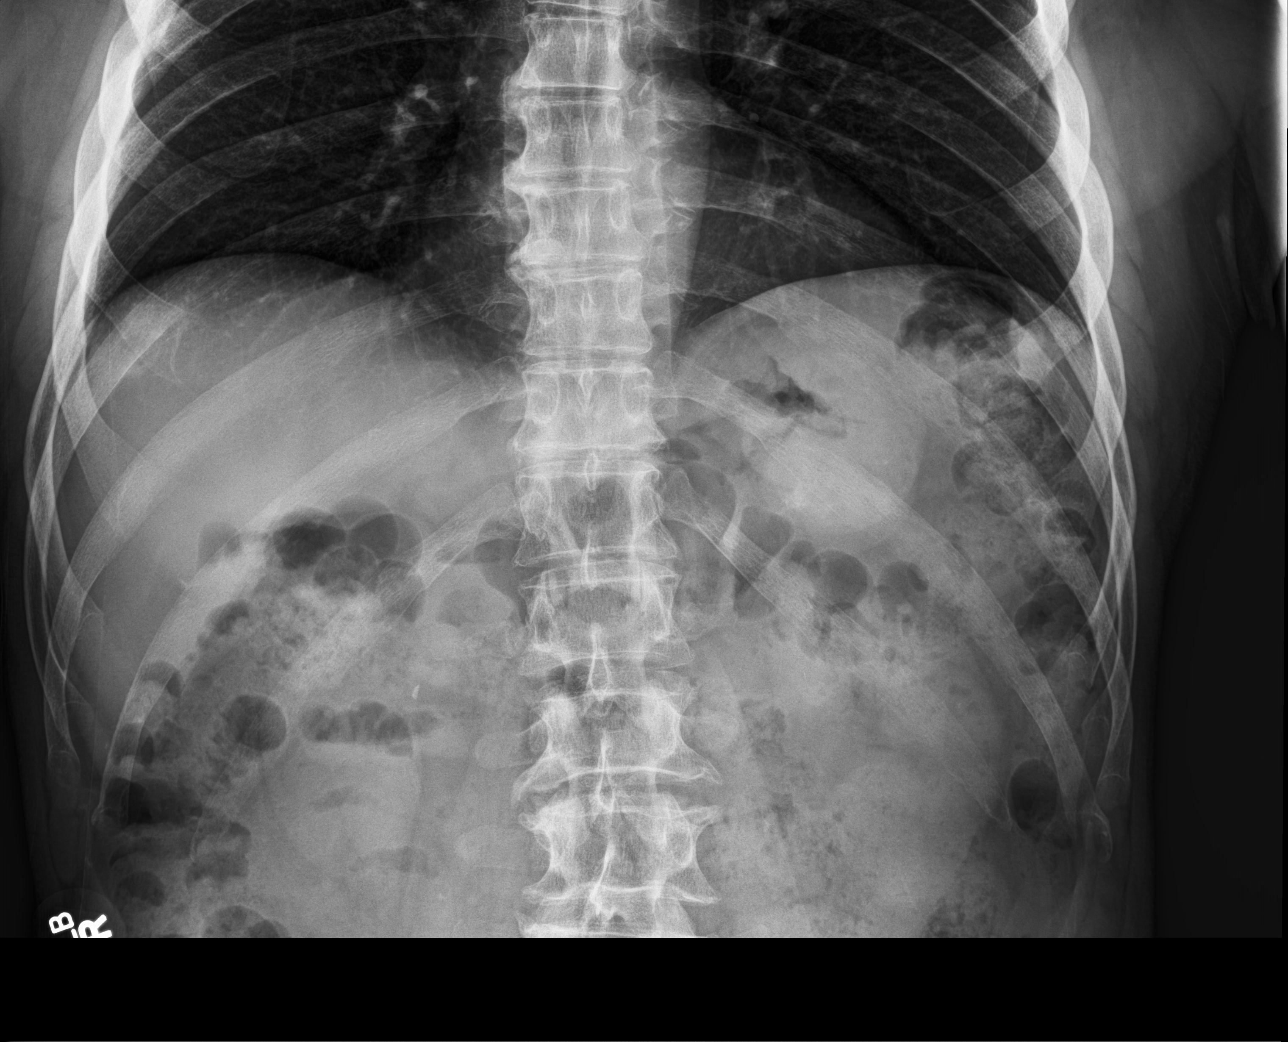

[abdomen supine (1 of 2)]
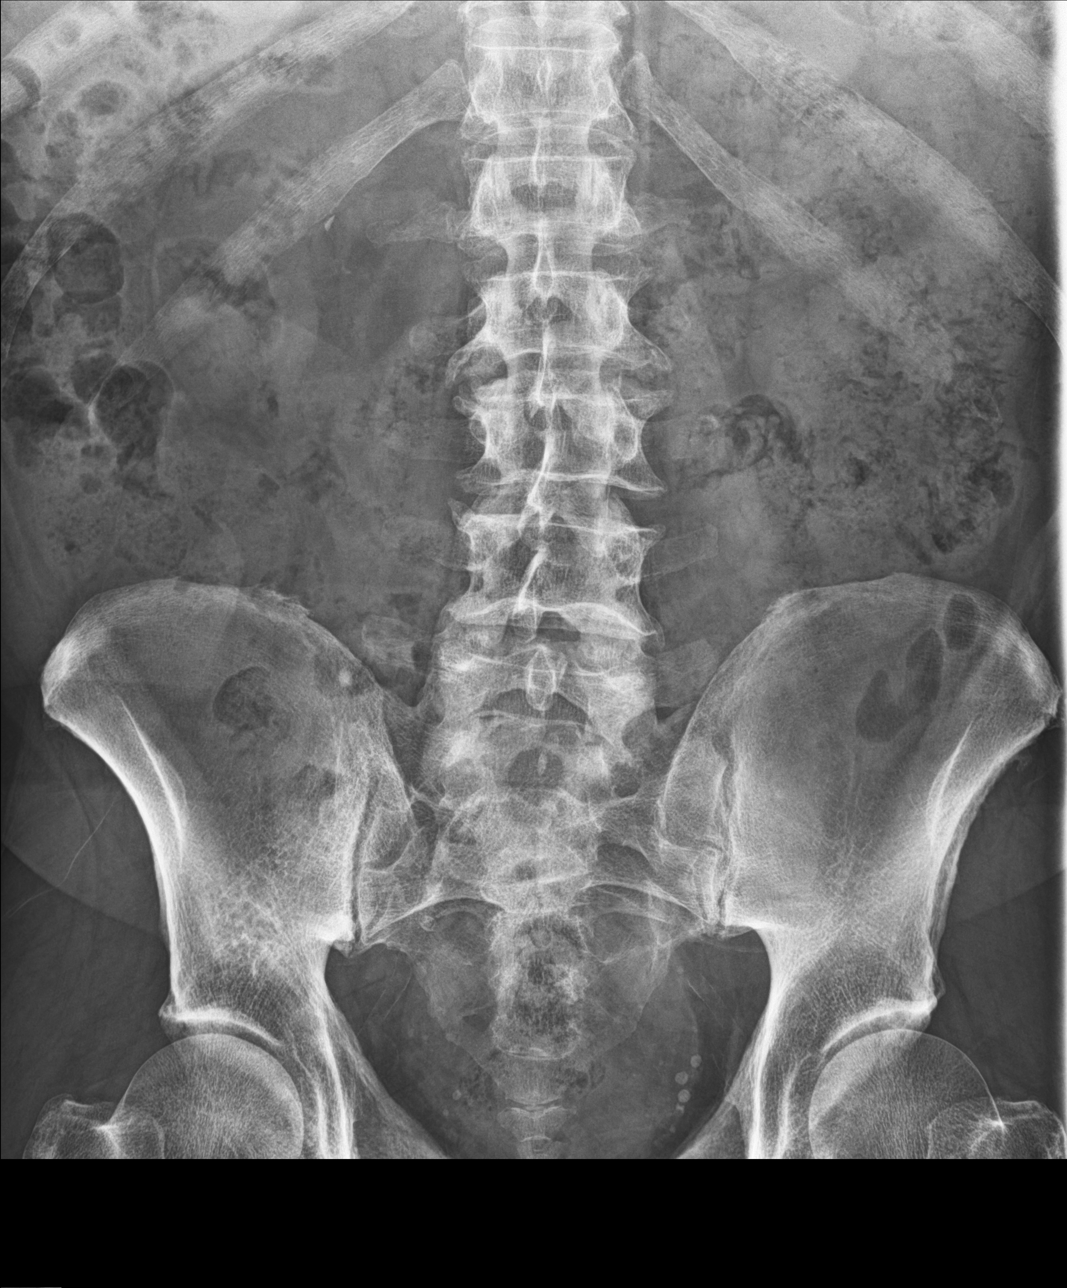

[abdomen supine (2 of 2)]
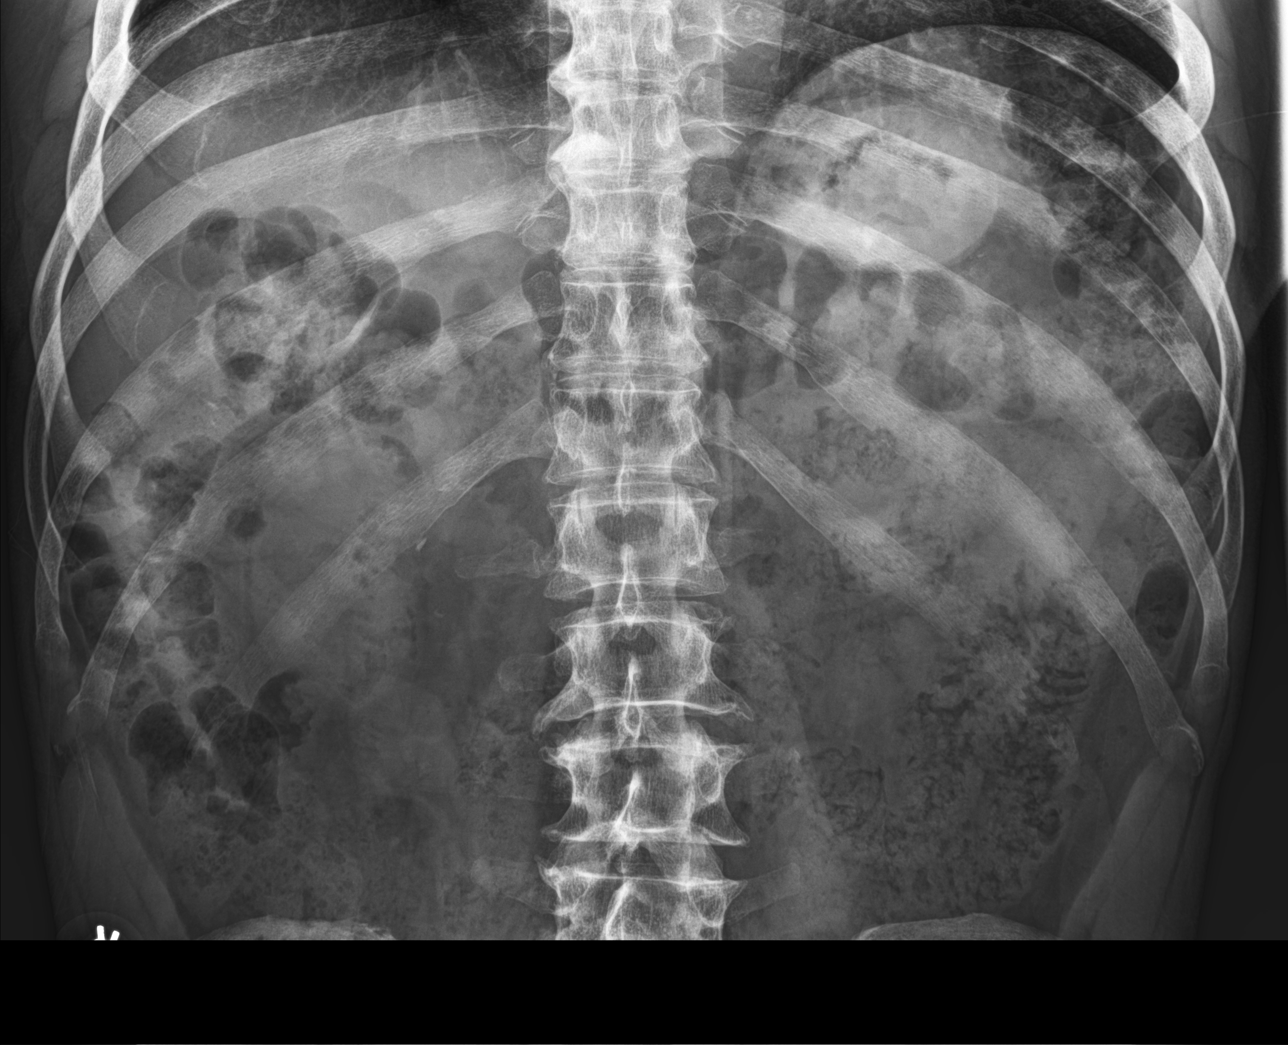

[4 of 4 positions shown; findings below may reference images not displayed]

FINDINGS: No dilated small bowel loops. Moderate colorectal stool volume. No
evidence of pneumatosis or pneumoperitoneum. Clear lung bases. No
radiopaque nephrolithiasis. Vascular calcifications noted medial to
the right kidney.
IMPRESSION: Nonobstructive bowel gas pattern. Moderate colorectal stool volume
may indicate constipation.

## 2021-05-09 IMAGING — DX PORTABLE CHEST - 1 VIEW
1 series · 1 of 1 positions shown · non-contrast
Comparison: None.

CLINICAL DATA: Fever for 3 days.

EXAM:
PORTABLE CHEST 1 VIEW

[chest ap]
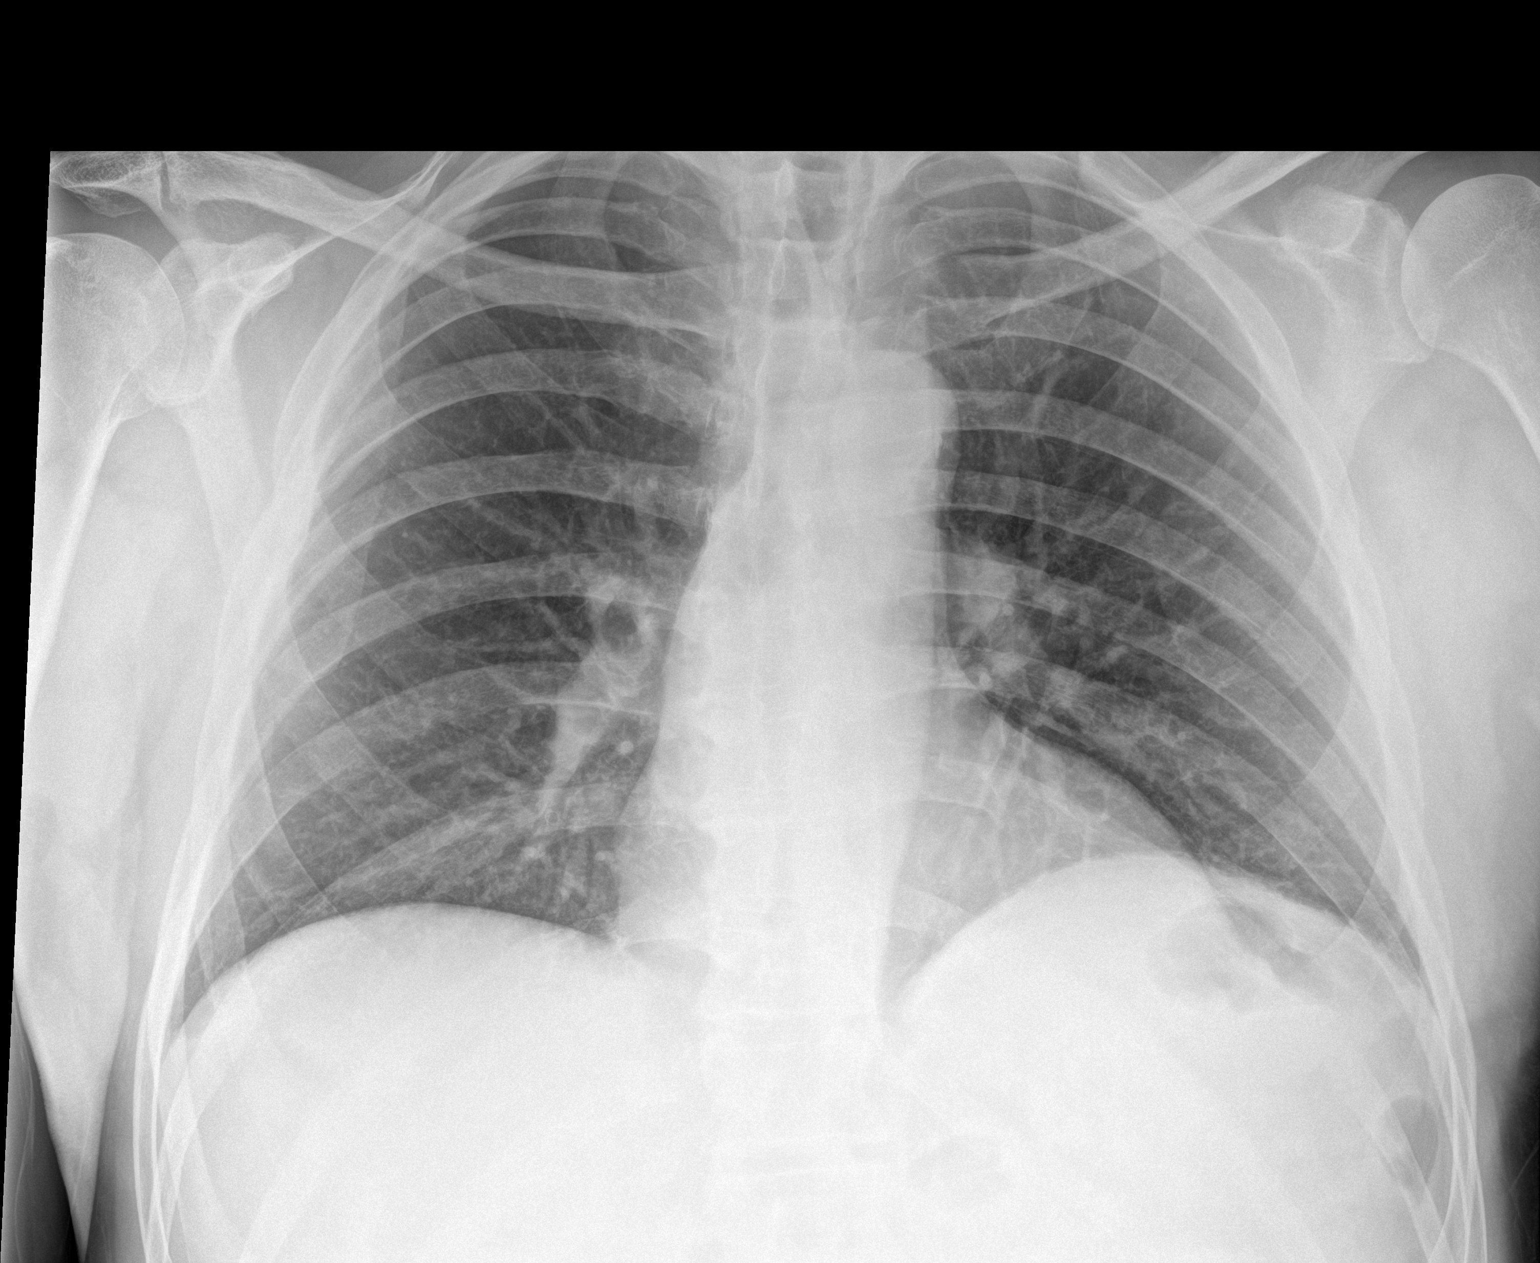

[1 of 1 positions shown; findings below may reference images not displayed]

FINDINGS: The cardiomediastinal silhouette is unremarkable.

There is no evidence of focal airspace disease, pulmonary edema,
suspicious pulmonary nodule/mass, pleural effusion, or pneumothorax.

No acute bony abnormalities are identified.
IMPRESSION: No active disease.

## 2023-04-06 ENCOUNTER — Other Ambulatory Visit: Payer: Self-pay

## 2023-04-06 ENCOUNTER — Ambulatory Visit: Admitting: Anesthesiology

## 2023-04-06 ENCOUNTER — Ambulatory Visit
Admission: RE | Admit: 2023-04-06 | Discharge: 2023-04-06 | Disposition: A | Discharge status: Home / Self Care | Attending: Gastroenterology | Admitting: Gastroenterology

## 2023-04-06 ENCOUNTER — Encounter
Admission: RE | Disposition: A | Discharge status: Home / Self Care | Payer: Self-pay | Source: Home / Self Care | Attending: Gastroenterology

## 2023-04-06 ENCOUNTER — Encounter: Payer: Self-pay | Admitting: *Deleted

## 2023-04-06 DIAGNOSIS — Z1211 Encounter for screening for malignant neoplasm of colon: Secondary | ICD-10-CM | POA: Insufficient documentation

## 2023-04-06 DIAGNOSIS — K640 First degree hemorrhoids: Secondary | ICD-10-CM | POA: Insufficient documentation

## 2023-04-06 DIAGNOSIS — I10 Essential (primary) hypertension: Secondary | ICD-10-CM | POA: Insufficient documentation

## 2023-04-06 DIAGNOSIS — E785 Hyperlipidemia, unspecified: Secondary | ICD-10-CM | POA: Diagnosis not present

## 2023-04-06 DIAGNOSIS — D124 Benign neoplasm of descending colon: Secondary | ICD-10-CM | POA: Diagnosis not present

## 2023-04-06 DIAGNOSIS — K5730 Diverticulosis of large intestine without perforation or abscess without bleeding: Secondary | ICD-10-CM | POA: Diagnosis not present

## 2023-04-06 DIAGNOSIS — K219 Gastro-esophageal reflux disease without esophagitis: Secondary | ICD-10-CM | POA: Insufficient documentation

## 2023-04-06 HISTORY — PX: COLONOSCOPY: SHX5424

## 2023-04-06 HISTORY — DX: Sleep apnea, unspecified: G47.30

## 2023-04-06 SURGERY — COLONOSCOPY
Anesthesia: General

## 2023-04-06 MED ORDER — LIDOCAINE HCL (PF) 2 % IJ SOLN
INTRAMUSCULAR | Status: AC
  Filled 2023-04-06: qty 5

## 2023-04-06 MED ORDER — LIDOCAINE HCL (CARDIAC) PF 100 MG/5ML IV SOSY
PREFILLED_SYRINGE | INTRAVENOUS | Status: DC | PRN
  Administered 2023-04-06: 80 mg via INTRAVENOUS

## 2023-04-06 MED ORDER — PROPOFOL 500 MG/50ML IV EMUL
INTRAVENOUS | Status: DC | PRN
  Administered 2023-04-06: 75 ug/kg/min via INTRAVENOUS

## 2023-04-06 MED ORDER — DEXMEDETOMIDINE HCL IN NACL 80 MCG/20ML IV SOLN
INTRAVENOUS | Status: AC
  Filled 2023-04-06: qty 20

## 2023-04-06 MED ORDER — PROPOFOL 1000 MG/100ML IV EMUL
INTRAVENOUS | Status: AC
  Filled 2023-04-06: qty 100

## 2023-04-06 MED ORDER — PROPOFOL 10 MG/ML IV BOLUS
INTRAVENOUS | Status: DC | PRN
  Administered 2023-04-06 (×2): 20 mg via INTRAVENOUS
  Administered 2023-04-06: 40 mg via INTRAVENOUS

## 2023-04-06 MED ORDER — SODIUM CHLORIDE 0.9 % IV SOLN: INTRAVENOUS | Status: DC

## 2023-04-06 MED ORDER — DEXMEDETOMIDINE HCL IN NACL 80 MCG/20ML IV SOLN
INTRAVENOUS | Status: DC | PRN
  Administered 2023-04-06: 8 ug via INTRAVENOUS
  Administered 2023-04-06: 12 ug via INTRAVENOUS

## 2023-04-06 NOTE — H&P (Signed)
 Outpatient short stay form Pre-procedure 04/06/2023  Regis Bill, MD  Primary Physician: Dione Housekeeper, MD  Reason for visit:  Screening  History of present illness:    77 y/o gentleman with history of HLD and hypertension here for screening colonoscopy. No blood thinners. No family history of GI malignancies. No significant abdominal surgeries.    Current Facility-Administered Medications:    0.9 %  sodium chloride infusion, , Intravenous, Continuous, Chizaram Latino, Rossie Muskrat, MD, Last Rate: 20 mL/hr at 04/06/23 0744, New Bag at 04/06/23 0744  Medications Prior to Admission  Medication Sig Dispense Refill Last Dose/Taking   atorvastatin (LIPITOR) 20 MG tablet Take by mouth.   04/06/2023 at  6:30 AM   Multiple Vitamins-Minerals (MULTIVITAMIN PO) Take by mouth.   Past Week   pantoprazole (PROTONIX) 40 MG tablet Take 40 mg by mouth daily. AM   04/06/2023 Morning   bacitracin ointment Apply 1 application topically 2 (two) times daily. (Patient not taking: Reported on 05/11/2020) 120 g 0    carboxymethylcellulose (REFRESH PLUS) 0.5 % SOLN 1 drop daily as needed.      cephALEXin (KEFLEX) 500 MG capsule Take 1 capsule (500 mg total) by mouth 2 (two) times daily. 14 capsule 0    chlorthalidone (HYGROTON) 25 MG tablet 12.5 mg.      dorzolamide (TRUSOPT) 2 % ophthalmic solution Place 1 drop into both eyes 2 (two) times daily.      fluticasone (FLONASE) 50 MCG/ACT nasal spray Place 2 sprays into both nostrils as needed.      latanoprost (XALATAN) 0.005 % ophthalmic solution Place 1 drop into both eyes at bedtime.      lisinopril (PRINIVIL,ZESTRIL) 20 MG tablet 10 mg.      Methylcellulose, Laxative, (CITRUCEL PO) Take by mouth as needed.      predniSONE (DELTASONE) 10 MG tablet Start with 3 pills tomorrow. Taper over the next 6 days.  3,3,2,2,1,1. 12 tablet 0    Probiotic Product (PROBIOTIC-10 PO) Take by mouth as needed.      tadalafil (CIALIS) 5 MG tablet Take 5 mg by mouth daily.  (Patient not taking: Reported on 05/11/2020)      timolol (TIMOPTIC) 0.5 % ophthalmic solution 1 drop 2 (two) times daily. (Patient not taking: Reported on 05/11/2020)        Allergies  Allergen Reactions   Flomax [Tamsulosin] Itching     Past Medical History:  Diagnosis Date   Arthritis    fingers   Dyspnea    with activity   GERD (gastroesophageal reflux disease)    Heart murmur    Hyperlipidemia    Hypertension    Leaky heart valve    Motion sickness    boats, car-back seat   Sleep apnea     Review of systems:  Otherwise negative.    Physical Exam  Gen: Alert, oriented. Appears stated age.  HEENT: PERRLA. Lungs: No respiratory distress CV: RRR Abd: soft, benign, no masses Ext: No edema    Planned procedures: Proceed with colonoscopy. The patient understands the nature of the planned procedure, indications, risks, alternatives and potential complications including but not limited to bleeding, infection, perforation, damage to internal organs and possible oversedation/side effects from anesthesia. The patient agrees and gives consent to proceed.  Please refer to procedure notes for findings, recommendations and patient disposition/instructions.     Regis Bill, MD [facility]

## 2023-04-06 NOTE — Interval H&P Note (Signed)
 History and Physical Interval Note:  04/06/2023 8:10 AM  Perry Taylor  has presented today for surgery, with the diagnosis of CCA SCREEN.  The various methods of treatment have been discussed with the patient and family. After consideration of risks, benefits and other options for treatment, the patient has consented to  Procedure(s): COLONOSCOPY (N/A) as a surgical intervention.  The patient's history has been reviewed, patient examined, no change in status, stable for surgery.  I have reviewed the patient's chart and labs.  Questions were answered to the patient's satisfaction.     Regis Bill  Ok to proceed with colonoscopy

## 2023-04-06 NOTE — Anesthesia Postprocedure Evaluation (Signed)
 Anesthesia Post Note  Patient: Perry Taylor  Procedure(s) Performed: COLONOSCOPY  Patient location during evaluation: Endoscopy Anesthesia Type: General Level of consciousness: awake and alert Pain management: pain level controlled Vital Signs Assessment: post-procedure vital signs reviewed and stable Respiratory status: spontaneous breathing, nonlabored ventilation, respiratory function stable and patient connected to nasal cannula oxygen Cardiovascular status: blood pressure returned to baseline and stable Postop Assessment: no apparent nausea or vomiting Anesthetic complications: no   No notable events documented.   Last Vitals:  Vitals:   04/06/23 0856 04/06/23 0904  BP: 126/78 127/75  Pulse: (!) 57 (!) 54  Resp: 15 15  Temp:    SpO2: 100% 100%    Last Pain:  Vitals:   04/06/23 0904  TempSrc:   PainSc: 0-No pain                 Cleda Mccreedy Sewell Pitner

## 2023-04-06 NOTE — Anesthesia Preprocedure Evaluation (Signed)
 Anesthesia Evaluation  Patient identified by MRN, date of birth, ID band Patient awake    Reviewed: Allergy & Precautions, NPO status , Patient's Chart, lab work & pertinent test results  History of Anesthesia Complications Negative for: history of anesthetic complications  Airway Mallampati: III  TM Distance: <3 FB Neck ROM: full    Dental  (+) Chipped   Pulmonary neg pulmonary ROS, neg shortness of breath   Pulmonary exam normal        Cardiovascular Exercise Tolerance: Good hypertension, (-) angina Normal cardiovascular exam     Neuro/Psych negative neurological ROS  negative psych ROS   GI/Hepatic Neg liver ROS,GERD  Controlled,,  Endo/Other  negative endocrine ROS    Renal/GU negative Renal ROS  negative genitourinary   Musculoskeletal   Abdominal   Peds  Hematology negative hematology ROS (+)   Anesthesia Other Findings Past Medical History: No date: Arthritis     Comment:  fingers No date: Dyspnea     Comment:  with activity No date: GERD (gastroesophageal reflux disease) No date: Heart murmur No date: Hyperlipidemia No date: Hypertension No date: Leaky heart valve No date: Motion sickness     Comment:  boats, car-back seat  Past Surgical History: 01/17/2016: CATARACT EXTRACTION W/PHACO; Right     Comment:  Procedure: CATARACT EXTRACTION PHACO AND INTRAOCULAR               LENS PLACEMENT (IOC);  Surgeon: Sherald Hess,              MD;  Location: [facility];  Service:               Ophthalmology;  Laterality: Right;  RIGHT MULTI FOCAL               TORIC 02/14/2016: CATARACT EXTRACTION W/PHACO; Left     Comment:  Procedure: CATARACT EXTRACTION PHACO AND INTRAOCULAR               LENS PLACEMENT (IOC)  Left eye  Multi focal Toric;                Surgeon: Sherald Hess, MD;  Location: [facility];  Service: Ophthalmology;  Laterality: Left; No  date: COLONOSCOPY 02/19/2018: COLONOSCOPY WITH PROPOFOL; N/A     Comment:  Procedure: COLONOSCOPY WITH PROPOFOL;  Surgeon:               Christena Deem, MD;  Location: [facility];                Service: Endoscopy;  Laterality: N/A; No date: ESOPHAGOGASTRODUODENOSCOPY 10/07/2014: ESOPHAGOGASTRODUODENOSCOPY; N/A     Comment:  Procedure: ESOPHAGOGASTRODUODENOSCOPY (EGD);  Surgeon:               Wallace Cullens, MD;  Location: [facility];  Service:               Gastroenterology;  Laterality: N/A; 02/19/2018: ESOPHAGOGASTRODUODENOSCOPY (EGD) WITH PROPOFOL; N/A     Comment:  Procedure: ESOPHAGOGASTRODUODENOSCOPY (EGD) WITH               PROPOFOL;  Surgeon: Christena Deem, MD;  Location:               [facility] END[facility];  Service: Endoscopy;  Laterality: N/A; No date: HEMORRHOID SURGERY 05/20/2020: SEPTOPLASTY; N/A     Comment:  Procedure: SEPTOPLASTY;  Surgeon: Vernie Murders, MD;  Location: [facility];  Service: ENT;                Laterality: N/A; 05/20/2020: TURBINATE REDUCTION; Bilateral     Comment:  Procedure: TURBINATE REDUCTION;  Surgeon: Vernie Murders,              MD;  Location: [facility];  Service: ENT;                Laterality: Bilateral;     Reproductive/Obstetrics negative OB ROS                             Anesthesia Physical Anesthesia Plan  ASA: 2  Anesthesia Plan: General   Post-op Pain Management:    Induction: Intravenous  PONV Risk Score and Plan: Propofol infusion and TIVA  Airway Management Planned: Natural Airway and Nasal Cannula  Additional Equipment:   Intra-op Plan:   Post-operative Plan:   Informed Consent: I have reviewed the patients History and Physical, chart, labs and discussed the procedure including the risks, benefits and alternatives for the proposed anesthesia with the patient or authorized representative who has indicated his/her understanding and acceptance.      Dental Advisory Given  Plan Discussed with: Anesthesiologist, CRNA and Surgeon  Anesthesia Plan Comments: (Patient consented for risks of anesthesia including but not limited to:  - adverse reactions to medications - risk of airway placement if required - damage to eyes, teeth, lips or other oral mucosa - nerve damage due to positioning  - sore throat or hoarseness - Damage to heart, brain, nerves, lungs, other parts of body or loss of life  Patient voiced understanding and assent.)       Anesthesia Quick Evaluation

## 2023-04-06 NOTE — Transfer of Care (Signed)
 Immediate Anesthesia Transfer of Care Note  Patient: Perry Taylor  Procedure(s) Performed: COLONOSCOPY  Patient Location: PACU  Anesthesia Type:General  Level of Consciousness: sedated  Airway & Oxygen Therapy: Patient Spontanous Breathing  Post-op Assessment: Report given to RN and Post -op Vital signs reviewed and stable  Post vital signs: Reviewed and stable  Last Vitals:  Vitals Value Taken Time  BP 118/78 04/06/23 0846  Temp 36.1 C 04/06/23 0846  Pulse 55 04/06/23 0847  Resp 11 04/06/23 0847  SpO2 98 % 04/06/23 0847  Vitals shown include unfiled device data.  Last Pain:  Vitals:   04/06/23 0846  TempSrc: Tympanic  PainSc: Asleep         Complications: No notable events documented.

## 2023-04-06 NOTE — Op Note (Signed)
 [facility] Gastroenterology Patient Name: Stratton Villwock Procedure Date: 04/06/2023 8:13 AM MRN: [id number] Account #: [id number] Date of Birth: Jun 04, 1945 Admit Type: Outpatient Age: 78 Room: [facility] ENDO ROOM 3 Gender: Male Note Status: Finalized Instrument Name: Prentice Docker [id number] Procedure:             Colonoscopy Indications:           Screening for colorectal malignant neoplasm Providers:             Eather Colas MD, MD Referring MD:          Eather Colas MD, MD (Referring MD), Nat Christen.                         Zada Finders, MD (Referring MD) Medicines:             Monitored Anesthesia Care Complications:         No immediate complications. Estimated blood loss:                         Minimal. Procedure:             Pre-Anesthesia Assessment:                        - Prior to the procedure, a History and Physical was                         performed, and patient medications and allergies were                         reviewed. The patient is competent. The risks and                         benefits of the procedure and the sedation options and                         risks were discussed with the patient. All questions                         were answered and informed consent was obtained.                         Patient identification and proposed procedure were                         verified by the physician, the nurse, the                         anesthesiologist, the anesthetist and the technician                         in the endoscopy suite. Mental Status Examination:                         alert and oriented. Airway Examination: normal                         oropharyngeal airway and neck mobility. Respiratory  Examination: clear to auscultation. CV Examination:                         normal. Prophylactic Antibiotics: The patient does not                         require prophylactic antibiotics. Prior                          Anticoagulants: The patient has taken no anticoagulant                         or antiplatelet agents. ASA Grade Assessment: II - A                         patient with mild systemic disease. After reviewing                         the risks and benefits, the patient was deemed in                         satisfactory condition to undergo the procedure. The                         anesthesia plan was to use monitored anesthesia care                         (MAC). Immediately prior to administration of                         medications, the patient was re-assessed for adequacy                         to receive sedatives. The heart rate, respiratory                         rate, oxygen saturations, blood pressure, adequacy of                         pulmonary ventilation, and response to care were                         monitored throughout the procedure. The physical                         status of the patient was re-assessed after the                         procedure.                        After obtaining informed consent, the colonoscope was                         passed under direct vision. Throughout the procedure,                         the patient's blood pressure, pulse, and oxygen  saturations were monitored continuously. The                         Colonoscope was introduced through the anus and                         advanced to the the cecum, identified by appendiceal                         orifice and ileocecal valve. The colonoscopy was                         somewhat difficult due to significant looping.                         Successful completion of the procedure was aided by                         applying abdominal pressure. The patient tolerated the                         procedure well. The quality of the bowel preparation                         was adequate to identify polyps. The ileocecal valve,                          appendiceal orifice, and rectum were photographed. Findings:      The perianal and digital rectal examinations were normal.      Two sessile polyps were found in the descending colon. The polyps were 2       to 3 mm in size. These polyps were removed with a cold snare. Resection       and retrieval were complete. Estimated blood loss was minimal.      A few small-mouthed diverticula were found in the sigmoid colon.      Internal hemorrhoids were found during retroflexion. The hemorrhoids       were Grade I (internal hemorrhoids that do not prolapse).      The exam was otherwise without abnormality on direct and retroflexion       views. Impression:            - Two 2 to 3 mm polyps in the descending colon,                         removed with a cold snare. Resected and retrieved.                        - Diverticulosis in the sigmoid colon.                        - Internal hemorrhoids.                        - The examination was otherwise normal on direct and                         retroflexion views. Recommendation:        - Discharge patient to home.                        -  Resume previous diet.                        - Continue present medications.                        - Await pathology results.                        - Repeat colonoscopy is not recommended due to current                         age (87 years or older) for surveillance.                        - Return to referring physician as previously                         scheduled. Procedure Code(s):     --- Professional ---                        [phone], Colonoscopy, flexible; with removal of                         tumor(s), polyp(s), or other lesion(s) by snare                         technique Diagnosis Code(s):     --- Professional ---                        Z12.11, Encounter for screening for malignant neoplasm                         of colon                        D12.4, Benign neoplasm of descending colon                         K64.0, First degree hemorrhoids                        K57.30, Diverticulosis of large intestine without                         perforation or abscess without bleeding CPT copyright 2022 [employer]. All rights reserved. The codes documented in this report are preliminary and upon coder review may  be revised to meet current compliance requirements. Eather Colas MD, MD 04/06/2023 8:47:42 AM Number of Addenda: 0 Note Initiated On: 04/06/2023 8:13 AM Scope Withdrawal Time: 0 hours 14 minutes 27 seconds  Total Procedure Duration: 0 hours 23 minutes 11 seconds  Estimated Blood Loss:  Estimated blood loss was minimal.      [facility]

## 2023-04-09 ENCOUNTER — Encounter: Payer: Self-pay | Admitting: Gastroenterology

## 2023-04-11 LAB — SURGICAL PATHOLOGY

## 2023-12-25 ENCOUNTER — Encounter: Payer: Self-pay | Admitting: Urology

## 2023-12-25 ENCOUNTER — Ambulatory Visit: Admitting: Urology

## 2023-12-25 VITALS — BP 124/76 | HR 71 | Ht 70.0 in | Wt 185.0 lb

## 2023-12-25 DIAGNOSIS — N5082 Scrotal pain: Secondary | ICD-10-CM

## 2023-12-25 DIAGNOSIS — R102 Pelvic and perineal pain unspecified side: Secondary | ICD-10-CM

## 2023-12-25 NOTE — Progress Notes (Signed)
 12/25/2023 1:01 PM   Perry Taylor Feb 14, 1945 [id number]  Referring provider: Eliverto Bette Hover, MD [address] [place],  [place] [address]  Chief Complaint  Patient presents with   Other    HPI: Perry Taylor is a 78 y.o. male referred for evaluation of perineal/scrotal pain.  His wife was with him today who contributed to the history.  Saw PCP 12/05/2023 with a 2-week history of what he described as a heat sensation in right posterior scrotum/perineum.  He states the discomfort has been intermittent.  It is not bothersome however he wanted to make sure it he did not have anything significant going on Scrotal ultrasound was performed 12/08/2023 which showed what was felt to be mild bilateral hydroceles and multiple small epididymal cyst of the right globus major, largest measuring 6 mm.  Testes were normal in appearance.  No significant changes from a prior scrotal ultrasound performed in 2022 Has been followed at [facility] and underwent HoLEP in 2022 for bothersome LUTS.  He was having similar symptoms prior to his surgery. No voiding complaints Denies dysuria, gross hematuria   PMH: Past Medical History:  Diagnosis Date   Arthritis    fingers   Dyspnea    with activity   GERD (gastroesophageal reflux disease)    Heart murmur    Hyperlipidemia    Hypertension    Leaky heart valve    Motion sickness    boats, car-back seat   Sleep apnea     Surgical History: Past Surgical History:  Procedure Laterality Date   CATARACT EXTRACTION W/PHACO Right 01/17/2016   Procedure: CATARACT EXTRACTION PHACO AND INTRAOCULAR LENS PLACEMENT (IOC);  Surgeon: Donzell Arlyce Budd, MD;  Location: [facility];  Service: Ophthalmology;  Laterality: Right;  RIGHT MULTI FOCAL TORIC   CATARACT EXTRACTION W/PHACO Left 02/14/2016   Procedure: CATARACT EXTRACTION PHACO AND INTRAOCULAR LENS PLACEMENT (IOC)  Left eye  Multi focal Toric;  Surgeon: Donzell Arlyce Budd, MD;  Location: [facility];  Service: Ophthalmology;  Laterality: Left;   COLONOSCOPY     COLONOSCOPY     COLONOSCOPY N/A 04/06/2023   Procedure: COLONOSCOPY;  Surgeon: Maryruth Ole DASEN, MD;  Location: [facility] ENDOSCOPY;  Service: Endoscopy;  Laterality: N/A;   COLONOSCOPY WITH PROPOFOL  N/A 02/19/2018   Procedure: COLONOSCOPY WITH PROPOFOL ;  Surgeon: Gaylyn Gladis PENNER, MD;  Location: [facility] ENDOSCOPY;  Service: Endoscopy;  Laterality: N/A;   ESOPHAGOGASTRODUODENOSCOPY     ESOPHAGOGASTRODUODENOSCOPY N/A 10/07/2014   Procedure: ESOPHAGOGASTRODUODENOSCOPY (EGD);  Surgeon: Deward CINDERELLA Piedmont, MD;  Location: [facility];  Service: Gastroenterology;  Laterality: N/A;   ESOPHAGOGASTRODUODENOSCOPY (EGD) WITH PROPOFOL  N/A 02/19/2018   Procedure: ESOPHAGOGASTRODUODENOSCOPY (EGD) WITH PROPOFOL ;  Surgeon: Gaylyn Gladis PENNER, MD;  Location: [facility] ENDOSCOPY;  Service: Endoscopy;  Laterality: N/A;   HEMORRHOID SURGERY     SEPTOPLASTY N/A 05/20/2020   Procedure: SEPTOPLASTY;  Surgeon: Edda Deward, MD;  Location: [facility];  Service: ENT;  Laterality: N/A;   TURBINATE REDUCTION Bilateral 05/20/2020   Procedure: MONTA REDUCTION;  Surgeon: Edda Deward, MD;  Location: [facility];  Service: ENT;  Laterality: Bilateral;    Home Medications:  Allergies as of 12/25/2023       Reactions   Flomax [tamsulosin] Itching        Medication List        Accurate as of December 25, 2023  1:01 PM. If you have any questions, ask your nurse or doctor.  STOP taking these medications    bacitracin  ointment Stopped by: Glendia Barba, MD   cephALEXin  500 MG capsule Commonly known as: Keflex  Stopped by: Glendia Barba, MD   dorzolamide 2 % ophthalmic solution Commonly known as: TRUSOPT Stopped by: Glendia Barba, MD   predniSONE  10 MG tablet Commonly known as: DELTASONE  Stopped by: Glendia Barba, MD   tadalafil 5 MG tablet Commonly known as:  CIALIS Stopped by: Glendia Barba, MD   timolol  0.5 % ophthalmic solution Commonly known as: TIMOPTIC  Stopped by: Glendia Barba, MD       TAKE these medications    aspirin EC 81 MG tablet Take 81 mg by mouth.   atorvastatin 20 MG tablet Commonly known as: LIPITOR Take by mouth.   carboxymethylcellulose 0.5 % Soln Commonly known as: REFRESH PLUS 1 drop daily as needed.   chlorthalidone 25 MG tablet Commonly known as: HYGROTON 12.5 mg.   CITRUCEL PO Take by mouth as needed.   fluticasone 50 MCG/ACT nasal spray Commonly known as: FLONASE Place 2 sprays into both nostrils as needed.   hydrOXYzine 25 MG tablet Commonly known as: ATARAX Take 25 mg by mouth at bedtime.   latanoprost 0.005 % ophthalmic solution Commonly known as: XALATAN Place 1 drop into both eyes at bedtime.   lisinopril 20 MG tablet Commonly known as: ZESTRIL 10 mg.   MULTIVITAMIN PO Take by mouth.   pantoprazole 40 MG tablet Commonly known as: PROTONIX Take 40 mg by mouth daily. AM   PARoxetine 10 MG tablet Commonly known as: PAXIL Take 10 mg by mouth daily.   potassium chloride SA 20 MEQ tablet Commonly known as: KLOR-CON M Take 20 mEq by mouth daily.   PROBIOTIC-10 PO Take by mouth as needed.   triamcinolone cream 0.1 % Commonly known as: KENALOG Apply 1 Application topically 2 (two) times daily.        Allergies: Allergies[1]  Family History: No family history on file.  Social History:  reports that he has quit smoking. His smoking use included cigarettes. He has a 15 pack-year smoking history. He quit smokeless tobacco use about 40 years ago. He reports that he does not drink alcohol and does not use drugs.   Physical Exam: BP 124/76   Pulse 71   Ht 5' 10 (1.778 m)   Wt 185 lb (83.9 kg)   BMI 26.54 kg/m   Constitutional:  Alert, No acute distress. HEENT: [place] AT Respiratory: Normal respiratory effort, no increased work of breathing. GU: Phallus without lesions.   Testes descended bilaterally without masses or tenderness.  Prominence of epididymal heads bilaterally without discrete mass. Psychiatric: Normal mood and affect.   Pertinent Imaging: Scrotal ultrasound was performed at [facility] and images were not available for review  Assessment & Plan:   78 y.o. male complaining of heat sensation right posterior scrotum/perineum which may be secondary to inflammatory prostatitis No significant abnormalities on scrotal sonogram He states his symptoms are not bothersome enough that he desires medical management Follow-up prn   Glendia JAYSON Barba, MD  [facility] [address], [address] [place], [place] [address] [phone]    [1]  Allergies Allergen Reactions   Flomax [Tamsulosin] Itching

## 2024-01-15 ENCOUNTER — Other Ambulatory Visit: Payer: Self-pay | Admitting: Student

## 2024-01-15 DIAGNOSIS — H903 Sensorineural hearing loss, bilateral: Secondary | ICD-10-CM

## 2024-01-16 ENCOUNTER — Encounter: Payer: Self-pay | Admitting: Student

## 2024-01-17 ENCOUNTER — Other Ambulatory Visit

## 2024-01-21 ENCOUNTER — Other Ambulatory Visit

## 2024-01-29 ENCOUNTER — Ambulatory Visit
Admission: RE | Admit: 2024-01-29 | Discharge: 2024-01-29 | Disposition: A | Source: Ambulatory Visit | Attending: Student

## 2024-01-29 DIAGNOSIS — H903 Sensorineural hearing loss, bilateral: Secondary | ICD-10-CM

## 2024-01-29 MED ORDER — GADOPICLENOL 0.5 MMOL/ML IV SOLN
10.0000 mL | Freq: Once | INTRAVENOUS | Status: AC | PRN
  Administered 2024-01-29: 8 mL via INTRAVENOUS
